# Patient Record
Sex: Female | Born: 1965 | Race: Black or African American | Hispanic: No | Smoking: Never smoker
Health system: Southern US, Community
[De-identification: ages and names within clinical notes are randomized; demographics above are authoritative.]

## PROBLEM LIST (undated history)

## (undated) DIAGNOSIS — M1990 Unspecified osteoarthritis, unspecified site: Secondary | ICD-10-CM

## (undated) DIAGNOSIS — I10 Essential (primary) hypertension: Secondary | ICD-10-CM

## (undated) HISTORY — PX: WISDOM TOOTH EXTRACTION: SHX21

## (undated) HISTORY — PX: ABDOMINAL HYSTERECTOMY: SHX81

---

## 2010-11-14 DIAGNOSIS — I10 Essential (primary) hypertension: Secondary | ICD-10-CM | POA: Insufficient documentation

## 2014-06-07 ENCOUNTER — Emergency Department (HOSPITAL_COMMUNITY): Payer: BLUE CROSS/BLUE SHIELD

## 2014-06-07 ENCOUNTER — Encounter (HOSPITAL_COMMUNITY): Payer: Self-pay | Admitting: Emergency Medicine

## 2014-06-07 ENCOUNTER — Emergency Department (HOSPITAL_COMMUNITY)
Admission: EM | Admit: 2014-06-07 | Discharge: 2014-06-07 | Disposition: A | Discharge status: Home / Self Care | Payer: BLUE CROSS/BLUE SHIELD | Attending: Emergency Medicine | Admitting: Emergency Medicine

## 2014-06-07 DIAGNOSIS — K5909 Other constipation: Secondary | ICD-10-CM | POA: Diagnosis not present

## 2014-06-07 DIAGNOSIS — R1031 Right lower quadrant pain: Secondary | ICD-10-CM | POA: Insufficient documentation

## 2014-06-07 DIAGNOSIS — K5900 Constipation, unspecified: Secondary | ICD-10-CM | POA: Diagnosis present

## 2014-06-07 HISTORY — DX: Essential (primary) hypertension: I10

## 2014-06-07 LAB — COMPREHENSIVE METABOLIC PANEL
ALT: 11 U/L (ref 0–35)
AST: 16 U/L (ref 0–37)
Albumin: 3.6 g/dL (ref 3.5–5.2)
Alkaline Phosphatase: 53 U/L (ref 39–117)
Anion gap: 4 — ABNORMAL LOW (ref 5–15)
BUN: 8 mg/dL (ref 6–23)
CO2: 30 mmol/L (ref 19–32)
CREATININE: 0.72 mg/dL (ref 0.50–1.10)
Calcium: 8.8 mg/dL (ref 8.4–10.5)
Chloride: 106 mmol/L (ref 96–112)
GFR calc Af Amer: >90 mL/min (ref >90)
Glucose, Bld: 100 mg/dL — ABNORMAL HIGH (ref 70–99)
Potassium: 3.6 mmol/L (ref 3.5–5.1)
Sodium: 140 mmol/L (ref 135–145)
Total Bilirubin: 0.2 mg/dL — ABNORMAL LOW (ref 0.3–1.2)
Total Protein: 6.7 g/dL (ref 6.0–8.3)

## 2014-06-07 LAB — URINALYSIS, ROUTINE W REFLEX MICROSCOPIC
BILIRUBIN URINE: NEGATIVE
Glucose, UA: NEGATIVE mg/dL
Hgb urine dipstick: NEGATIVE
KETONES UR: NEGATIVE mg/dL
Nitrite: NEGATIVE
Protein, ur: NEGATIVE mg/dL
Specific Gravity, Urine: 1.01 (ref 1.005–1.030)
Urobilinogen, UA: 0.2 mg/dL (ref 0.0–1.0)
pH: 7.5 (ref 5.0–8.0)

## 2014-06-07 LAB — CBC WITH DIFFERENTIAL/PLATELET
Basophils Absolute: 0 10*3/uL (ref 0.0–0.1)
Basophils Relative: 1 % (ref 0–1)
EOS ABS: 0.1 10*3/uL (ref 0.0–0.7)
EOS PCT: 2 % (ref 0–5)
HCT: 37.9 % (ref 36.0–46.0)
HEMOGLOBIN: 12.7 g/dL (ref 12.0–15.0)
Lymphocytes Relative: 49 % — ABNORMAL HIGH (ref 12–46)
Lymphs Abs: 2.8 10*3/uL (ref 0.7–4.0)
MCH: 32.1 pg (ref 26.0–34.0)
MCHC: 33.5 g/dL (ref 30.0–36.0)
MCV: 95.7 fL (ref 78.0–100.0)
Monocytes Absolute: 0.4 10*3/uL (ref 0.1–1.0)
Monocytes Relative: 7 % (ref 3–12)
NEUTROS ABS: 2.4 10*3/uL (ref 1.7–7.7)
Neutrophils Relative %: 41 % — ABNORMAL LOW (ref 43–77)
Platelets: 352 10*3/uL (ref 150–400)
RBC: 3.96 MIL/uL (ref 3.87–5.11)
RDW: 12.4 % (ref 11.5–15.5)
WBC: 5.7 10*3/uL (ref 4.0–10.5)

## 2014-06-07 LAB — URINE MICROSCOPIC-ADD ON

## 2014-06-07 LAB — LIPASE, BLOOD: Lipase: 23 U/L (ref 11–59)

## 2014-06-07 MED ORDER — HYDROMORPHONE HCL 1 MG/ML IJ SOLN
0.5000 mg | Freq: Once | INTRAMUSCULAR | Status: AC
  Administered 2014-06-07: 0.5 mg via INTRAVENOUS
  Filled 2014-06-07: qty 1

## 2014-06-07 MED ORDER — IOHEXOL 300 MG/ML  SOLN
100.0000 mL | Freq: Once | INTRAMUSCULAR | Status: AC | PRN
  Administered 2014-06-07: 100 mL via INTRAVENOUS

## 2014-06-07 MED ORDER — OXYCODONE-ACETAMINOPHEN 5-325 MG PO TABS: 1.0000 | ORAL_TABLET | Freq: Four times a day (QID) | ORAL | Status: DC | PRN

## 2014-06-07 MED ORDER — SODIUM CHLORIDE 0.9 % IV BOLUS (SEPSIS)
1000.0000 mL | INTRAVENOUS | Status: AC
  Administered 2014-06-07: 1000 mL via INTRAVENOUS

## 2014-06-07 MED ORDER — IOHEXOL 300 MG/ML  SOLN
25.0000 mL | Freq: Once | INTRAMUSCULAR | Status: AC | PRN
  Administered 2014-06-07: 25 mL via ORAL

## 2014-06-07 MED ORDER — HYDROMORPHONE HCL 1 MG/ML IJ SOLN
0.5000 mg | INTRAMUSCULAR | Status: AC
  Administered 2014-06-07: 0.5 mg via INTRAVENOUS
  Filled 2014-06-07: qty 1

## 2014-06-07 MED ORDER — PEG 3350-KCL-NABCB-NACL-NASULF 236 G PO SOLR: 4.0000 L | Freq: Once | ORAL | Status: DC

## 2014-06-07 NOTE — ED Notes (Signed)
Family at bedside. 

## 2014-06-07 NOTE — ED Notes (Signed)
CT informed patient done with contrast.  

## 2014-06-07 NOTE — ED Provider Notes (Signed)
CSN: [id number]     Arrival date & time 06/07/14  4098 History   First MD Initiated Contact with Patient 06/07/14 [id number]     Chief Complaint  Patient presents with  . Constipation     (Consider location/radiation/quality/duration/timing/severity/associated sxs/prior Treatment) Patient is a 49 y.o. female presenting with constipation. The history is provided by the patient.  Constipation Severity:  Moderate Time since last bowel movement:  4 days Timing:  Constant Progression:  Worsening Chronicity:  Recurrent Stool description:  Hard Unusual stool frequency:  Infrequent, every 1-2 weeks Relieved by:  Nothing Worsened by:  Nothing tried Ineffective treatments: dulcolax, otc laxatives. Associated symptoms: abdominal pain   Associated symptoms: no back pain, no diarrhea, no dysuria, no fever, no nausea and no vomiting     No past medical history on file. No past surgical history on file. No family history on file. History  Substance Use Topics  . Smoking status: Not on file  . Smokeless tobacco: Not on file  . Alcohol Use: Not on file   OB History    No data available     Review of Systems  Constitutional: Negative for fever and fatigue.  HENT: Negative for congestion and drooling.   Eyes: Negative for pain.  Respiratory: Negative for cough and shortness of breath.   Cardiovascular: Negative for chest pain.  Gastrointestinal: Positive for abdominal pain and constipation. Negative for nausea, vomiting and diarrhea.  Genitourinary: Negative for dysuria and hematuria.  Musculoskeletal: Negative for back pain, gait problem and neck pain.  Skin: Negative for color change.  Neurological: Negative for dizziness and headaches.  Hematological: Negative for adenopathy.  Psychiatric/Behavioral: Negative for behavioral problems.  All other systems reviewed and are negative.     Allergies  Review of patient's allergies indicates no known allergies.  Home Medications   Prior  to Admission medications   Not on File   BP 129/79 mmHg  Pulse 106  Temp(Src) 98.3 F (36.8 C) (Oral)  Resp 19  Ht  (1.626 m)  Wt 173 lb (78.472 kg)  BMI 29.68 kg/m2  SpO2 98% Physical Exam  Constitutional: She is oriented to person, place, and time. She appears well-developed and well-nourished.  HENT:  Head: Normocephalic.  Mouth/Throat: No oropharyngeal exudate.  Eyes: Conjunctivae and EOM are normal. Pupils are equal, round, and reactive to light.  Neck: Normal range of motion. Neck supple.  Cardiovascular: Normal rate, regular rhythm, normal heart sounds and intact distal pulses.  Exam reveals no gallop and no friction rub.   No murmur heard. Pulmonary/Chest: Effort normal and breath sounds normal. No respiratory distress. She has no wheezes.  Abdominal: Soft. Bowel sounds are normal. There is tenderness (mild ttp w/ deep palpation of rlq). There is no rebound and no guarding.  Genitourinary:  Normal-appearing external rectum. No stool noted in the rectal vault.  Musculoskeletal: Normal range of motion. She exhibits no edema or tenderness.  Neurological: She is alert and oriented to person, place, and time.  Skin: Skin is warm and dry.  Psychiatric: She has a normal mood and affect. Her behavior is normal.  Nursing note and vitals reviewed.   ED Course  Procedures (including critical care time) Labs Review Labs Reviewed  CBC WITH DIFFERENTIAL/PLATELET - Abnormal; Notable for the following:    Neutrophils Relative % 41 (*)    Lymphocytes Relative 49 (*)    All other components within normal limits  COMPREHENSIVE METABOLIC PANEL - Abnormal; Notable for the following:  Glucose, Bld 100 (*)    Total Bilirubin 0.2 (*)    Anion gap 4 (*)    All other components within normal limits  URINALYSIS, ROUTINE W REFLEX MICROSCOPIC - Abnormal; Notable for the following:    Leukocytes, UA SMALL (*)    All other components within normal limits  URINE MICROSCOPIC-ADD ON -  Abnormal; Notable for the following:    Squamous Epithelial / LPF FEW (*)    All other components within normal limits  LIPASE, BLOOD    Imaging Review Ct Abdomen Pelvis W Contrast  06/07/2014   CLINICAL DATA:  RLQ PAIN, NAUSEA, CONSTIPATION SINCE Wednesday, HYSTERECTOMY, HTN, PT. HAD INCOMPLETE COLONOSCOPY SEVERAL YEARS AGO IN [place] AND WAS SUPPOSED TO HAVE A FOLLOW-UP BUT WAS NON-COMPLIANT, MOTHER IN ROOM WITH PATIENT ASKED ME, "DID SHE TELL YOU ABOUT THE KNOT SHE FEELS ON THE RIGHT SIDE?"  EXAM: CT ABDOMEN AND PELVIS WITH CONTRAST  TECHNIQUE: Multidetector CT imaging of the abdomen and pelvis was performed using the standard protocol following bolus administration of intravenous contrast.  CONTRAST:  OMNIPAQUE IOHEXOL 300 MG/ML  SOLN  COMPARISON:  09/28/2010  FINDINGS: The lung bases demonstrate minimal linear atelectasis left base.  Abdominal images demonstrate a couple tiny subcentimeter liver hypodensities likely cysts. The spleen, pancreas, gallbladder and adrenal glands are within normal. Kidneys are normal in size without hydronephrosis or nephrolithiasis. Ureters are within normal. Appendix is normal. No free fluid or focal inflammatory change.  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 There is a 2.6 cm simple right ovarian cyst. Interval removal of previously noted left ovarian dermoid cyst. There is no free pelvic fluid. The bladder and rectosigmoid colon are unremarkable. Remainder of the exam is unchanged.  IMPRESSION: No acute findings in the abdomen/pelvis.  Couple tiny sub cm hypodensities within the liver likely cysts but too small to characterize.  Minimal left basilar linear atelectasis.   Electronically Signed   By: Elberta Fortis M.D.   On: 06/07/2014 10:31     EKG Interpretation None      MDM   Final diagnoses:  RLQ abdominal pain    8:20 AM 49 y.o. female who presents with right lower quadrant pain which began 3-4 days ago. She states that she  has a long history of constipation and her last bowel movement was approximately one week ago. She denies any associated symptoms including vomiting or fever. Vital signs are unremarkable here. Normal rectal exam without fecal impaction. Will get CT due to localized pain.    I interpreted/reviewed the labs and/or imaging which were non-contributory. I offered golytely and the pt would like this. Given her failure to have a bowel movement with most over-the-counter agents to think this is reasonable. She requested information on following up with a PCP. I added this to her discharge paperwork. I have discussed the diagnosis/risks/treatment options with the patient and believe the pt to be eligible for discharge home to follow-up with and establish w/ a pcp. We also discussed returning to the ED immediately if new or worsening sx occur. We discussed the sx which are most concerning (e.g., worsening pain, fever, vomiting) that necessitate immediate return. Medications administered to the patient during their visit and any new prescriptions provided to the patient are listed below.  Medications given during this visit Medications  HYDROmorphone (DILAUDID) injection 0.5 mg (0.5 mg Intravenous Given 06/07/14 0831)  sodium chloride 0.9 % bolus 1,000 mL (0 mLs Intravenous Stopped 06/07/14 1156)  iohexol (OMNIPAQUE)  300 MG/ML solution 25 mL (25 mLs Oral Contrast Given 06/07/14 0832)  iohexol (OMNIPAQUE) 300 MG/ML solution 100 mL (100 mLs Intravenous Contrast Given 06/07/14 0939)  HYDROmorphone (DILAUDID) injection 0.5 mg (0.5 mg Intravenous Given 06/07/14 1035)    Discharge Medication List as of 06/07/2014 11:59 AM    START taking these medications   Details  oxyCODONE-acetaminophen (PERCOCET) 5-325 MG per tablet Take 1 tablet by mouth every 6 (six) hours as needed for moderate pain., Starting 06/07/2014, Until Discontinued, Print    polyethylene glycol (GOLYTELY) 236 G solution Take 4,000 mLs by mouth once., Starting  06/07/2014, Print           Purvis SheffieldForrest Dimitriy Carreras, MD 06/07/14 [phone]

## 2014-06-07 NOTE — Discharge Instructions (Signed)
Abdominal Pain, Women °Abdominal (stomach, pelvic, or belly) pain can be caused by many things. It is important to tell your doctor: °· The location of the pain. °· Does it come and go or is it present all the time? °· Are there things that start the pain (eating certain foods, exercise)? °· Are there other symptoms associated with the pain (fever, nausea, vomiting, diarrhea)? °All of this is helpful to know when trying to find the cause of the pain. °CAUSES  °· Stomach: virus or bacteria infection, or ulcer. °· Intestine: appendicitis (inflamed appendix), regional ileitis (Crohn's disease), ulcerative colitis (inflamed colon), irritable bowel syndrome, diverticulitis (inflamed diverticulum of the colon), or cancer of the stomach or intestine. °· Gallbladder disease or stones in the gallbladder. °· Kidney disease, kidney stones, or infection. °· Pancreas infection or cancer. °· Fibromyalgia (pain disorder). °· Diseases of the female organs: °· Uterus: fibroid (non-cancerous) tumors or infection. °· Fallopian tubes: infection or tubal pregnancy. °· Ovary: cysts or tumors. °· Pelvic adhesions (scar tissue). °· Endometriosis (uterus lining tissue growing in the pelvis and on the pelvic organs). °· Pelvic congestion syndrome (female organs filling up with blood just before the menstrual period). °· Pain with the menstrual period. °· Pain with ovulation (producing an egg). °· Pain with an IUD (intrauterine device, birth control) in the uterus. °· Cancer of the female organs. °· Functional pain (pain not caused by a disease, may improve without treatment). °· Psychological pain. °· Depression. °DIAGNOSIS  °Your doctor will decide the seriousness of your pain by doing an examination. °· Blood tests. °· X-rays. °· Ultrasound. °· CT scan (computed tomography, special type of X-ray). °· MRI (magnetic resonance imaging). °· Cultures, for infection. °· Barium enema (dye inserted in the large intestine, to better view it with  X-rays). °· Colonoscopy (looking in intestine with a lighted tube). °· Laparoscopy (minor surgery, looking in abdomen with a lighted tube). °· Major abdominal exploratory surgery (looking in abdomen with a large incision). °TREATMENT  °The treatment will depend on the cause of the pain.  °· Many cases can be observed and treated at home. °· Over-the-counter medicines recommended by your caregiver. °· Prescription medicine. °· Antibiotics, for infection. °· Birth control pills, for painful periods or for ovulation pain. °· Hormone treatment, for endometriosis. °· Nerve blocking injections. °· Physical therapy. °· Antidepressants. °· Counseling with a psychologist or psychiatrist. °· Minor or major surgery. °HOME CARE INSTRUCTIONS  °· Do not take laxatives, unless directed by your caregiver. °· Take over-the-counter pain medicine only if ordered by your caregiver. Do not take aspirin because it can cause an upset stomach or bleeding. °· Try a clear liquid diet (broth or water) as ordered by your caregiver. Slowly move to a bland diet, as tolerated, if the pain is related to the stomach or intestine. °· Have a thermometer and take your temperature several times a day, and record it. °· Bed rest and sleep, if it helps the pain. °· Avoid sexual intercourse, if it causes pain. °· Avoid stressful situations. °· Keep your follow-up appointments and tests, as your caregiver orders. °· If the pain does not go away with medicine or surgery, you may try: °· Acupuncture. °· Relaxation exercises (yoga, meditation). °· Group therapy. °· Counseling. °SEEK MEDICAL CARE IF:  °· You notice certain foods cause stomach pain. °· Your home care treatment is not helping your pain. °· You need stronger pain medicine. °· You want your IUD removed. °· You feel faint or   lightheaded. °· You develop nausea and vomiting. °· You develop a rash. °· You are having side effects or an allergy to your medicine. °SEEK IMMEDIATE MEDICAL CARE IF:  °· Your  pain does not go away or gets worse. °· You have a fever. °· Your pain is felt only in portions of the abdomen. The right side could possibly be appendicitis. The left lower portion of the abdomen could be colitis or diverticulitis. °· You are passing blood in your stools (bright red or black tarry stools, with or without vomiting). °· You have blood in your urine. °· You develop chills, with or without a fever. °· You pass out. °MAKE SURE YOU:  °· Understand these instructions. °· Will watch your condition. °· Will get help right away if you are not doing well or get worse. °Document Released: 02/12/2007 Document Revised: 09/01/2013 Document Reviewed: 03/04/2009 °ExitCare® Patient Information ©2015 ExitCare, LLC. This information is not intended to replace advice given to you by your health care provider. Make sure you discuss any questions you have with your health care provider. ° °Constipation °Constipation is when a person has fewer than three bowel movements a week, has difficulty having a bowel movement, or has stools that are dry, hard, or larger than normal. As people grow older, constipation is more common. If you try to fix constipation with medicines that make you have a bowel movement (laxatives), the problem may get worse. Long-term laxative use may cause the muscles of the colon to become weak. A low-fiber diet, not taking in enough fluids, and taking certain medicines may make constipation worse.  °CAUSES  °· Certain medicines, such as antidepressants, pain medicine, iron supplements, antacids, and water pills.   °· Certain diseases, such as diabetes, irritable bowel syndrome (IBS), thyroid disease, or depression.   °· Not drinking enough water.   °· Not eating enough fiber-rich foods.   °· Stress or travel.   °· Lack of physical activity or exercise.   °· Ignoring the urge to have a bowel movement.   °· Using laxatives too much.   °SIGNS AND SYMPTOMS  °· Having fewer than three bowel movements a  week.   °· Straining to have a bowel movement.   °· Having stools that are hard, dry, or larger than normal.   °· Feeling full or bloated.   °· Pain in the lower abdomen.   °· Not feeling relief after having a bowel movement.   °DIAGNOSIS  °Your health care provider will take a medical history and perform a physical exam. Further testing may be done for severe constipation. Some tests may include: °· A barium enema X-ray to examine your rectum, colon, and, sometimes, your small intestine.   °· A sigmoidoscopy to examine your lower colon.   °· A colonoscopy to examine your entire colon. °TREATMENT  °Treatment will depend on the severity of your constipation and what is causing it. Some dietary treatments include drinking more fluids and eating more fiber-rich foods. Lifestyle treatments may include regular exercise. If these diet and lifestyle recommendations do not help, your health care provider may recommend taking over-the-counter laxative medicines to help you have bowel movements. Prescription medicines may be prescribed if over-the-counter medicines do not work.  °HOME CARE INSTRUCTIONS  °· Eat foods that have a lot of fiber, such as fruits, vegetables, whole grains, and beans. °· Limit foods high in fat and processed sugars, such as french fries, hamburgers, cookies, candies, and soda.   °· A fiber supplement may be added to your diet if you cannot get enough fiber from foods.   °·   Drink enough fluids to keep your urine clear or pale yellow.   Exercise regularly or as directed by your health care provider.   Go to the restroom when you have the urge to go. Do not hold it.   Only take over-the-counter or prescription medicines as directed by your health care provider. Do not take other medicines for constipation without talking to your health care provider first.  SEEK IMMEDIATE MEDICAL CARE IF:   You have bright red blood in your stool.   Your constipation lasts for more than 4 days or gets  worse.   You have abdominal or rectal pain.   You have thin, pencil-like stools.   You have unexplained weight loss. MAKE SURE YOU:   Understand these instructions.  Will watch your condition.  Will get help right away if you are not doing well or get worse. Document Released: 01/14/2004 Document Revised: 04/22/2013 Document Reviewed: 01/27/2013 [facility] Patient Information 2015 [place], [place]. This information is not intended to replace advice given to you by your health care provider. Make sure you discuss any questions you have with your health care provider.   Emergency Department Resource Guide 1) Find a Doctor and Pay Out of Pocket Although you won't have to find out who is covered by your insurance plan, it is a good idea to ask around and get recommendations. You will then need to call the office and see if the doctor you have chosen will accept you as a new patient and what types of options they offer for patients who are self-pay. Some doctors offer discounts or will set up payment plans for their patients who do not have insurance, but you will need to ask so you aren't surprised when you get to your appointment.  2) Contact Your Local Health Department Not all health departments have doctors that can see patients for sick visits, but many do, so it is worth a call to see if yours does. If you don't know where your local health department is, you can check in your phone book. The CDC also has a tool to help you locate your state's health department, and many state websites also have listings of all of their local health departments.  3) Find a Walk-in Clinic If your illness is not likely to be very severe or complicated, you may want to try a walk in clinic. These are popping up all over the country in pharmacies, drugstores, and shopping centers. They're usually staffed by nurse practitioners or physician assistants that have been trained to treat common illnesses and  complaints. They're usually fairly quick and inexpensive. However, if you have serious medical issues or chronic medical problems, these are probably not your best option.  No Primary Care Doctor: - Call Health Connect at  [phone] - they can help you locate a primary care doctor that  accepts your insurance, provides certain services, etc. - Physician Referral Service- [phone]  Chronic Pain Problems: Organization         Address  Phone   Notes  [facility]  [phone] Patients need to be referred by their primary care doctor.   Medication Assistance: Organization         Address  Phone   Notes  [facility] [address] [place], [place] [phone] --Must be a resident of [facility] [place] -- Must have NO insurance coverage whatsoever (no Medicaid/ Medicare, etc.) -- The pt. MUST have  a primary care doctor that directs their care regularly and follows them in the community   [facility]ist  [phone]   [employer]  [phone]    Agencies that provide inexpensive medical care: Organization         Address  Phone   Notes  [facility]  [phone]   [facility]    [phone]   [facility] [address] [place], [place] [address] [phone]   [facility] [address] [place], [place] [phone]   Planned Parenthood    [phone]   [facility]    [phone]   [facility]  [address], [place] Phone:  [phone], Fax:  [phone] Hours of Operation:  9 am - 6 pm, M-F.  Also accepts Medicaid/Medicare and self-pay.  [facility]  [address], [place] Phone: [phone], Fax: [phone]. Hours of Operation:  8:30 am - 5:30 pm, M-F.  Also accepts Medicaid and self-pay.  [facility] [place] [address] [place] Phone: [phone]   [facility] [address] Natasha Bence [place], [place] [phone], Ext. [phone] Mondays & Thursdays: 7-9 AM.  First 15 patients are seen on a first come, first serve basis.    Medicaid-accepting [facility] Providers:  Organization         Address  Phone   Notes  [facility] [address], [place] [phone] Also accepts self-pay patients.  [facility] [address] Laurell Josephs [place], [place]  [phone]   [facility] [address], [place] [phone]   [facility] [address] [place] [phone]   Renaye Rakers [address], [place]   [phone] Only accepts [place] Access [place] patients after they have their name applied to their card.   Self-Pay (no insurance) in [facility]:  Organization         Address  Phone   Notes  Sickle Cell Patients, [facility] [address] [place], [place] [phone]   [facility] [address] [place], [place] [phone]   [facility]  [address], [place] [phone]   [facility]/Dr. Osei-Bonsu  [address], [place] or [address], [place] [phone] Phone number for both [place] and [place] locations is the same.  [facility] [address], [place] [phone]   [facility] [address], [place] or [address] [phone]   [facility] [address] [place] [phone], phone; [phone], fax Sees patients 1st and 3rd Saturday of every month.  Must not qualify for public or private insurance (i.e. Medicaid, Medicare, [place] Health Choice, Veterans' Benefits)  Household income should be no more than 200% of the poverty level  The clinic cannot treat you if you are pregnant or think you are pregnant  Sexually transmitted diseases are not treated at the clinic.    Dental Care: Organization         Address  Phone  Notes  [facility] Department of Public Health [facility] [address] [place], [place] (  [phone] Accepts children up to age 37 who are enrolled in Medicaid or [place] Health Choice; pregnant women with a Medicaid card; and children who have applied for Medicaid or [place] Health Choice, but were declined, whose parents can pay a reduced fee at time of service.  [facility] Department of [facility]  [address], [place] [phone] Accepts children up to age 40 who are enrolled in [place] or [place] Health Choice; pregnant women with a Medicaid card; and children who have applied for Medicaid or [place] Health Choice, but were declined, whose parents can pay a reduced fee at time of service.  [facility]  [address] [place], [place] [phone] Patients are seen by appointment only. Walk-ins are not accepted. [facility] will see patients 24 years of age and older. Monday - Tuesday (8am-5pm) Most Wednesdays (8:30-5pm) $30 per visit, cash only  [facility] Access PRO[facility]  [address], [facility] [phone] Patients are seen by appointment only. Walk-ins are not accepted. [facility] will see patients 8 years of age and older. One Wednesday Evening (Monthly: Volunteer Based).  $30 per visit, cash only  [employer]  [phone] for adults; Children under age 64, call Graduate Pediatric Dentistry at [phone]. Children aged 67-14, please call [phone] to request a pediatric application.  Dental services are provided in all areas of dental care including fillings, crowns and bridges, complete and partial dentures, implants, gum treatment, root canals, and extractions. Preventive care is  also provided. Treatment is provided to both adults and children. Patients are selected via a lottery and there is often a waiting list.   [facility] [address] [employer] [place]  [phone] [email or web address]   [facility] [address] [place], [place] [phone], Ext. [phone] Second and Fourth Thursday of each month, opens at 6:30 AM; Clinic ends at 9 AM.  Patients are seen on a first-come first-served basis, and a limited number are seen during each clinic.   [facility]  [address] Ether Griffins [place], [place] [phone]   Eligibility Requirements You must have lived in [place], [place], or [place] counties for at least the last three months.   You cannot be eligible for state or federal sponsored [place], including [employer], [place], or [employer].   You generally cannot be eligible for healthcare insurance through your employer.    How to apply: Eligibility screenings are held every Tuesday and Wednesday afternoon from 1:00 pm until 4:00 pm. You do not need an appointment for the interview!  [facility] [address] [place], [place] [phone]   [facility] Health Department  [phone]   [facility] Health Department  [phone]   [facility] Health Department  [phone]    Behavioral Health Resources in the Community: Intensive Outpatient Programs Organization         Address  Phone  Notes  [facility] Services [address], [place], [place] [phone]   [facility] Outpatient [address], [place], [place] [phone]   [facility] [address] [place], [place]  [phone]   [facility] Mental Health [address]  [place], [place] [phone] or [phone]   Substance Abuse Resources Organization         Address  Phone  Notes  [facility] Drug Services  [phone]  [facility]  [phone]   [facility] [place]  [phone]   Floydene Flock  [phone]   Residential & Outpatient Substance Abuse Program  [phone]   Psychological Services Organization         Address  Phone  Notes  [facility]  [phone]   [facility]  [phone]   [facility] Mental Health [address] [place] [phone] or [phone]    Mobile Crisis Teams Organization         Address  Phone  Notes  [facility], Mobile Crisis Care Unit  [phone]   [facility]  [address] [place], [place] [phone]   Doristine Locks [address] [place] [phone]    Self-Help/Support Groups Organization         Address  Phone             Notes  [facility] [place] - variety of support groups  336- I7437963 Call for more information  Narcotics Anonymous (NA), [facility] [address], [employer] [place]  2 meetings at this location   Statistician         Address  Phone  Notes  [facility] [address],    [place]  [phone]   [facility]  [address], [place] [address], [place], [place] [phone]   [facility] [address] [place], [place] [phone] Admissions: 8am-3pm M-F  [facility] [address],    [place], [place] [phone]   [facility] [address] [place], [place], [place] [phone]   [facility] [address]  [place], [place] [phone]   [facility] - Intensive Outpatient [address] Laurell Josephs [address], [place], [place] [phone]   [facility] ([facility] [address] [place]  [place], [place] [phone] or [phone]   [facility] ([facility]) [address], [place], [place] [phone] Accepts Medicaid  [facility] [employer] [address]  [place] [phone]  Substance Abuse/Addiction Treatment   [facility] Organization         Address  Phone  Notes  [facility]  [phone]   Angie Fava, PhD [address] Ervin Knack [place], [place]   [phone] or [phone]   [facility] Behavioral   [address] [place], [place] [phone]   [facility] [address] [place], [place] [phone] Insurance/Medicaid/sponsorship through [facility] and Families [address]                                    [place], [place] [phone] Therapy/tele-psych/case  [facility] [address], [place] [phone]    Dr. Lolly Mustache  [phone]   [facility]  [employer] [facility] Dept. 1) [address], [place] 2) [address], [place] 3)  [address], [place] [phone]  [phone]   [facility] Child Abuse Hotline (650)296-1942 or [phone] (After Hours)

## 2014-06-07 NOTE — ED Notes (Addendum)
Pt states: I had a colonoscopy one time and they couldn't finish it because I had a blockage and they wanted me to go to the hospital and this was two years ago. This time, I have not been able to go since Wednesday and I have taken four pink laxatives and a few of those orange ones too. I have a knot in the right side of my belly and it hurts and makes me nauseous. Pt reports she really doesn't ever have BM's and can go two weeks without having one and when she does it is small pebbles.  Pt reports not drinking water with her laxatives, pain 10/10.

## 2015-04-08 ENCOUNTER — Encounter (HOSPITAL_COMMUNITY): Payer: Self-pay | Admitting: Emergency Medicine

## 2015-04-08 ENCOUNTER — Emergency Department (HOSPITAL_COMMUNITY)
Admission: EM | Admit: 2015-04-08 | Discharge: 2015-04-08 | Disposition: A | Discharge status: Home / Self Care | Payer: BLUE CROSS/BLUE SHIELD | Attending: Emergency Medicine | Admitting: Emergency Medicine

## 2015-04-08 DIAGNOSIS — I10 Essential (primary) hypertension: Secondary | ICD-10-CM | POA: Diagnosis not present

## 2015-04-08 DIAGNOSIS — Z90710 Acquired absence of both cervix and uterus: Secondary | ICD-10-CM | POA: Diagnosis not present

## 2015-04-08 DIAGNOSIS — K5900 Constipation, unspecified: Secondary | ICD-10-CM | POA: Diagnosis not present

## 2015-04-08 DIAGNOSIS — R110 Nausea: Secondary | ICD-10-CM | POA: Diagnosis not present

## 2015-04-08 DIAGNOSIS — Z79899 Other long term (current) drug therapy: Secondary | ICD-10-CM | POA: Insufficient documentation

## 2015-04-08 DIAGNOSIS — R1031 Right lower quadrant pain: Secondary | ICD-10-CM | POA: Insufficient documentation

## 2015-04-08 LAB — COMPREHENSIVE METABOLIC PANEL
ALBUMIN: 3.6 g/dL (ref 3.5–5.0)
ALT: 13 U/L — ABNORMAL LOW (ref 14–54)
AST: 15 U/L (ref 15–41)
Alkaline Phosphatase: 59 U/L (ref 38–126)
Anion gap: 6 (ref 5–15)
BUN: 8 mg/dL (ref 6–20)
CHLORIDE: 108 mmol/L (ref 101–111)
CO2: 26 mmol/L (ref 22–32)
Calcium: 9.4 mg/dL (ref 8.9–10.3)
Creatinine, Ser: 0.66 mg/dL (ref 0.44–1.00)
GFR calc Af Amer: >60 mL/min (ref >60)
GFR calc non Af Amer: >60 mL/min (ref >60)
GLUCOSE: 108 mg/dL — AB (ref 65–99)
POTASSIUM: 3.7 mmol/L (ref 3.5–5.1)
Sodium: 140 mmol/L (ref 135–145)
Total Bilirubin: 0.3 mg/dL (ref 0.3–1.2)
Total Protein: 7 g/dL (ref 6.5–8.1)

## 2015-04-08 LAB — URINALYSIS, ROUTINE W REFLEX MICROSCOPIC
Bilirubin Urine: NEGATIVE
GLUCOSE, UA: NEGATIVE mg/dL
Hgb urine dipstick: NEGATIVE
KETONES UR: NEGATIVE mg/dL
LEUKOCYTES UA: NEGATIVE
Nitrite: NEGATIVE
PH: 6 (ref 5.0–8.0)
Protein, ur: NEGATIVE mg/dL
SPECIFIC GRAVITY, URINE: 1.019 (ref 1.005–1.030)

## 2015-04-08 LAB — CBC
HEMATOCRIT: 41.1 % (ref 36.0–46.0)
HEMOGLOBIN: 13.8 g/dL (ref 12.0–15.0)
MCH: 32 pg (ref 26.0–34.0)
MCHC: 33.6 g/dL (ref 30.0–36.0)
MCV: 95.4 fL (ref 78.0–100.0)
Platelets: 383 10*3/uL (ref 150–400)
RBC: 4.31 MIL/uL (ref 3.87–5.11)
RDW: 12.4 % (ref 11.5–15.5)
WBC: 5.4 10*3/uL (ref 4.0–10.5)

## 2015-04-08 LAB — LIPASE, BLOOD: LIPASE: 23 U/L (ref 11–51)

## 2015-04-08 MED ORDER — SODIUM CHLORIDE 0.9 % IV BOLUS (SEPSIS)
1000.0000 mL | Freq: Once | INTRAVENOUS | Status: AC
  Administered 2015-04-08: 1000 mL via INTRAVENOUS

## 2015-04-08 MED ORDER — NAPROXEN 500 MG PO TABS: 500.0000 mg | ORAL_TABLET | Freq: Two times a day (BID) | ORAL | Status: DC

## 2015-04-08 MED ORDER — DOCUSATE SODIUM 100 MG PO CAPS: 100.0000 mg | ORAL_CAPSULE | Freq: Two times a day (BID) | ORAL | Status: AC

## 2015-04-08 MED ORDER — PEG 3350-KCL-NABCB-NACL-NASULF 236 G PO SOLR: 4.0000 L | Freq: Once | ORAL | Status: DC

## 2015-04-08 MED ORDER — ONDANSETRON HCL 4 MG/2ML IJ SOLN
4.0000 mg | Freq: Once | INTRAMUSCULAR | Status: AC
  Administered 2015-04-08: 4 mg via INTRAVENOUS
  Filled 2015-04-08: qty 2

## 2015-04-08 MED ORDER — POLYETHYLENE GLYCOL 3350 17 GM/SCOOP PO POWD: ORAL | Status: AC

## 2015-04-08 MED ORDER — MORPHINE SULFATE (PF) 4 MG/ML IV SOLN
4.0000 mg | Freq: Once | INTRAVENOUS | Status: AC
  Administered 2015-04-08: 4 mg via INTRAVENOUS
  Filled 2015-04-08: qty 1

## 2015-04-08 MED ORDER — KETOROLAC TROMETHAMINE 30 MG/ML IJ SOLN
30.0000 mg | Freq: Once | INTRAMUSCULAR | Status: AC
  Administered 2015-04-08: 30 mg via INTRAVENOUS
  Filled 2015-04-08: qty 1

## 2015-04-08 NOTE — ED Provider Notes (Signed)
CSN: [id number]     Arrival date & time 04/08/15  [id number] History   First MD Initiated Contact with Patient 04/08/15 1056     Chief Complaint  Patient presents with  . Abdominal Pain     (Consider location/radiation/quality/duration/timing/severity/associated sxs/prior Treatment) Patient is a 49 y.o. female presenting with abdominal pain.  Abdominal Pain Pain location:  RLQ Pain quality: aching   Pain quality comment:  Like a knot Pain radiates to:  Does not radiate Pain severity:  Moderate Onset quality:  Gradual Duration:  4 days Timing:  Intermittent (waxing and waning) Progression:  Waxing and waning Chronicity:  New Relieved by:  Nothing Worsened by:  Nothing tried Ineffective treatments: laxatives. Associated symptoms: constipation and nausea   Associated symptoms: no chest pain, no diarrhea, no dysuria, no fever, no hematemesis, no hematochezia, no hematuria, no melena, no shortness of breath, no vaginal bleeding, no vaginal discharge and no vomiting     Patient with hx of constipation presents with RLQ pain x 4 days she describes as feeling like a knot and similar to the past when she was found to have constipation.  She has tried x-lax and ducolax and had a BM yesterday.  She reports she typically has a BM every 2 weeks or so.  She does not take anything QD for constipation.  Denies changes in BM or stool caliber.  No bloody stools.    Past Medical History  Diagnosis Date  . Hypertension    Past Surgical History  Procedure Laterality Date  . Abdominal hysterectomy     History reviewed. No pertinent family history. Social History  Substance Use Topics  . Smoking status: Never Smoker   . Smokeless tobacco: Never Used  . Alcohol Use: No   OB History    No data available     Review of Systems  Constitutional: Negative for fever.  Respiratory: Negative for shortness of breath.   Cardiovascular: Negative for chest pain.  Gastrointestinal: Positive for nausea,  abdominal pain, constipation, abdominal distention (bloating) and rectal pain. Negative for vomiting, diarrhea, blood in stool, melena, hematochezia, anal bleeding and hematemesis.  Genitourinary: Negative for dysuria, urgency, frequency, hematuria, flank pain, vaginal bleeding and vaginal discharge.  All other systems reviewed and are negative.     Allergies  Review of patient's allergies indicates no known allergies.  Home Medications   Prior to Admission medications   Medication Sig Start Date End Date Taking? Authorizing Provider  amLODipine (NORVASC) 5 MG tablet Take 5 mg by mouth daily. 03/19/15  Yes Historical Provider, MD  losartan (COZAAR) 100 MG tablet Take 100 mg by mouth daily. 03/19/15  Yes Historical Provider, MD  docusate sodium (COLACE) 100 MG capsule Take 1 capsule (100 mg total) by mouth every 12 (twelve) hours. 04/08/15   Cheri FowlerKayla Kizzy Olafson, PA-C  naproxen (NAPROSYN) 500 MG tablet Take 1 tablet (500 mg total) by mouth 2 (two) times daily. 04/08/15   Cheri FowlerKayla Mylani Gentry, PA-C  polyethylene glycol (GOLYTELY) 236 G solution Take 4,000 mLs by mouth once. 04/08/15   Pricilla LovelessScott Goldston, MD  polyethylene glycol powder (GLYCOLAX/MIRALAX) powder Take 1 capful dissolved in 4-8 ounces of water/juice daily 04/08/15   Romaine Maciolek, PA-C   BP 116/69 mmHg  Pulse 73  Temp(Src) 98.2 F (36.8 C) (Oral)  Resp 20  SpO2 98% Physical Exam  Constitutional: She is oriented to person, place, and time. She appears well-developed and well-nourished.  HENT:  Head: Normocephalic and atraumatic.  Mouth/Throat: Oropharynx is clear and moist.  Eyes: Conjunctivae are normal. Pupils are equal, round, and reactive to light.  Neck: Normal range of motion. Neck supple.  Cardiovascular: Normal rate, regular rhythm and normal heart sounds.   No murmur heard. Pulmonary/Chest: Effort normal and breath sounds normal. No accessory muscle usage or stridor. No respiratory distress. She has no wheezes. She has no rhonchi. She has no  rales.  Abdominal: Soft. Bowel sounds are normal. She exhibits no distension. There is tenderness in the right upper quadrant and right lower quadrant. There is no rebound and no guarding.  Genitourinary: Rectum normal. Rectal exam shows no external hemorrhoid, no internal hemorrhoid and no tenderness.  Musculoskeletal: Normal range of motion.  Lymphadenopathy:    She has no cervical adenopathy.  Neurological: She is alert and oriented to person, place, and time.  Speech clear without dysarthria.  Skin: Skin is warm and dry.  Psychiatric: She has a normal mood and affect. Her behavior is normal.    ED Course  Procedures (including critical care time) Labs Review Labs Reviewed  COMPREHENSIVE METABOLIC PANEL - Abnormal; Notable for the following:    Glucose, Bld 108 (*)    ALT 13 (*)    All other components within normal limits  LIPASE, BLOOD  CBC  URINALYSIS, ROUTINE W REFLEX MICROSCOPIC (NOT AT [facility])    Imaging Review No results found. I have personally reviewed and evaluated these images and lab results as part of my medical decision-making.   EKG Interpretation None      MDM   Final diagnoses:  Right lower quadrant abdominal pain  Constipation, unspecified constipation type    Patient presents with RLQ pain x 4 days with hx of constipation.  No fevers, change in BM or change in stool caliber.  No bloody stools.  VSS, NAD.  On exam, heart RRR, lungs CTAB, abdomen soft with tenderness in RLQ and RUQ.  No rebound or guarding.  Concern for constipation, volvulus, appendicitis, SBO, fecal impaction.  Will obtain labs.  Will give fluids, zofran, and morphine.  Labs unremarkable.  No indication for imaging at this time.  Low suspicion for volvulus, appendicitis, obstruction.  High suspicion for constipation.  Patient's symptoms have improved.  Will d/c home with naproxen, golytely, miralax, and colace.  Will also provide resource list for establishing PCP.  Discussed strict return  precautions including worsening pain or no pain relief with BM and new bowel regimen.  Patient agrees and acknowledges the above plan for discharge. Case has been discussed with and seen by Dr. Criss Alvine who agrees with the above plan for discharge.     Cheri Fowler, PA-C 04/08/15 1313  Pricilla Loveless, MD 04/11/15 [phone]

## 2015-04-08 NOTE — Discharge Instructions (Signed)
Abdominal Pain, Adult °Many things can cause abdominal pain. Usually, abdominal pain is not caused by a disease and will improve without treatment. It can often be observed and treated at home. Your health care provider will do a physical exam and possibly order blood tests and X-rays to help determine the seriousness of your pain. However, in many cases, more time must pass before a clear cause of the pain can be found. Before that point, your health care provider may not know if you need more testing or further treatment. °HOME CARE INSTRUCTIONS °Monitor your abdominal pain for any changes. The following actions may help to alleviate any discomfort you are experiencing: °· Only take over-the-counter or prescription medicines as directed by your health care provider. °· Do not take laxatives unless directed to do so by your health care provider. °· Try a clear liquid diet (broth, tea, or water) as directed by your health care provider. Slowly move to a bland diet as tolerated. °SEEK MEDICAL CARE IF: °· You have unexplained abdominal pain. °· You have abdominal pain associated with nausea or diarrhea. °· You have pain when you urinate or have a bowel movement. °· You experience abdominal pain that wakes you in the night. °· You have abdominal pain that is worsened or improved by eating food. °· You have abdominal pain that is worsened with eating fatty foods. °· You have a fever. °SEEK IMMEDIATE MEDICAL CARE IF: °· Your pain does not go away within 2 hours. °· You keep throwing up (vomiting). °· Your pain is felt only in portions of the abdomen, such as the right side or the left lower portion of the abdomen. °· You pass bloody or black tarry stools. °MAKE SURE YOU: °· Understand these instructions. °· Will watch your condition. °· Will get help right away if you are not doing well or get worse. °  °This information is not intended to replace advice given to you by your health care provider. Make sure you discuss  any questions you have with your health care provider. °  °Document Released: 01/25/2005 Document Revised: 01/06/2015 Document Reviewed: 12/25/2012 °Elsevier Interactive Patient Education ©2016 Elsevier Inc. ° °Constipation, Adult °Constipation is when a person has fewer than three bowel movements a week, has difficulty having a bowel movement, or has stools that are dry, hard, or larger than normal. As people grow older, constipation is more common. A low-fiber diet, not taking in enough fluids, and taking certain medicines may make constipation worse.  °CAUSES  °· Certain medicines, such as antidepressants, pain medicine, iron supplements, antacids, and water pills.   °· Certain diseases, such as diabetes, irritable bowel syndrome (IBS), thyroid disease, or depression.   °· Not drinking enough water.   °· Not eating enough fiber-rich foods.   °· Stress or travel.   °· Lack of physical activity or exercise.   °· Ignoring the urge to have a bowel movement.   °· Using laxatives too much.   °SIGNS AND SYMPTOMS  °· Having fewer than three bowel movements a week.   °· Straining to have a bowel movement.   °· Having stools that are hard, dry, or larger than normal.   °· Feeling full or bloated.   °· Pain in the lower abdomen.   °· Not feeling relief after having a bowel movement.   °DIAGNOSIS  °Your health care provider will take a medical history and perform a physical exam. Further testing may be done for severe constipation. Some tests may include: °· A barium enema X-ray to examine your rectum, colon, and, sometimes,   your small intestine.   A sigmoidoscopy to examine your lower colon.   A colonoscopy to examine your entire colon. TREATMENT  Treatment will depend on the severity of your constipation and what is causing it. Some dietary treatments include drinking more fluids and eating more fiber-rich foods. Lifestyle treatments may include regular exercise. If these diet and lifestyle recommendations do not  help, your health care provider may recommend taking over-the-counter laxative medicines to help you have bowel movements. Prescription medicines may be prescribed if over-the-counter medicines do not work.  HOME CARE INSTRUCTIONS   Eat foods that have a lot of fiber, such as fruits, vegetables, whole grains, and beans.  Limit foods high in fat and processed sugars, such as french fries, hamburgers, cookies, candies, and soda.   A fiber supplement may be added to your diet if you cannot get enough fiber from foods.   Drink enough fluids to keep your urine clear or pale yellow.   Exercise regularly or as directed by your health care provider.   Go to the restroom when you have the urge to go. Do not hold it.   Only take over-the-counter or prescription medicines as directed by your health care provider. Do not take other medicines for constipation without talking to your health care provider first.  SEEK IMMEDIATE MEDICAL CARE IF:   You have bright red blood in your stool.   Your constipation lasts for more than 4 days or gets worse.   You have abdominal or rectal pain.   You have thin, pencil-like stools.   You have unexplained weight loss. MAKE SURE YOU:   Understand these instructions.  Will watch your condition.  Will get help right away if you are not doing well or get worse.   This information is not intended to replace advice given to you by your health care provider. Make sure you discuss any questions you have with your health care provider.   Document Released: 01/14/2004 Document Revised: 05/08/2014 Document Reviewed: 01/27/2013 Elsevier Interactive Patient Education 2016 [employer].   Emergency Department Resource Guide 1) Find a Doctor and Pay Out of Pocket Although you won't have to find out who is covered by your insurance plan, it is a good idea to ask around and get recommendations. You will then need to call the office and see if the doctor you  have chosen will accept you as a new patient and what types of options they offer for patients who are self-pay. Some doctors offer discounts or will set up payment plans for their patients who do not have insurance, but you will need to ask so you aren't surprised when you get to your appointment.  2) Contact Your Local Health Department Not all health departments have doctors that can see patients for sick visits, but many do, so it is worth a call to see if yours does. If you don't know where your local health department is, you can check in your phone book. The CDC also has a tool to help you locate your state's health department, and many state websites also have listings of all of their local health departments.  3) Find a Walk-in Clinic If your illness is not likely to be very severe or complicated, you may want to try a walk in clinic. These are popping up all over the country in pharmacies, drugstores, and shopping centers. They're usually staffed by nurse practitioners or physician assistants that have been trained to treat common illnesses and complaints.  They're usually fairly quick and inexpensive. However, if you have serious medical issues or chronic medical problems, these are probably not your best option.  No Primary Care Doctor: - Call Health Connect at  [phone] - they can help you locate a primary care doctor that  accepts your insurance, provides certain services, etc. - Physician Referral Service- [phone]  Chronic Pain Problems: Organization         Address  Phone   Notes  [facility]  [phone] Patients need to be referred by their primary care doctor.   Medication Assistance: Organization         Address  Phone   Notes  [facility] Medication [facility] [address] [place] [place], [place] [address] [phone] --Must be a resident of [facility] -- Must have NO insurance coverage whatsoever (no Medicaid/ Medicare,  etc.) -- The pt. MUST have a primary care doctor that directs their care regularly and follows them in the community   [facility]  [phone]   [employer]  [phone]    Agencies that provide inexpensive medical care: Organization         Address  Phone   Notes  [facility]  [phone]   [facility]    [phone]   [facility] [address] [place], [place] [address] [phone]   [facility] [address] [place] [place] [phone]   Planned Parenthood    [phone]   [facility]    [phone]   [facility]  [address], [place] Phone:  [phone], Fax:  [phone] Hours of Operation:  9 am - 6 pm, M-F.  Also accepts Medicaid/Medicare and self-pay.  [facility]  [address], [place] Phone: [phone], Fax: [phone]. Hours of Operation:  8:30 am - 5:30 pm, M-F.  Also accepts Medicaid and self-pay.  [facility] [place] [address], [place] Phone: [phone]   [facility] [address] Natasha Bence [place], [place] [phone], Ext. [phone] Mondays & Thursdays: 7-9 AM.  First 15 patients are seen on a first come, first serve basis.    Medicaid-accepting [facility] Providers:  Organization         Address  Phone   Notes  [facility] [address], [place] [phone] Also accepts self-pay patients.  [facility] [address] Laurell Josephs [place], [place]  [phone]   [facility] [address], [place] [phone]   [facility] [address] [place] [phone]   Renaye Rakers [address], [place]   [phone] Only accepts [place] patients after they have their name applied to their card.   Self-Pay (no  insurance) in [facility]:  Organization         Address  Phone   Notes  Sickle Cell Patients, [facility] [address] [place], [place] [phone]   [facility] [address] [place], [place] [phone]   [facility]  [address], [place] [phone]   [facility]/Dr. Osei-Bonsu  [address], [place] or [address], [place] [phone])  [phone] Phone number for both [facility] and [place] locations is the same.  [facility] [address] [place] [phone]   [facility] [address], [place] or [address] [phone]   [facility] [address], [place] [phone], phone; [phone], fax Sees patients 1st and 3rd Saturday of every month.  Must not qualify for public or private insurance (i.e. Medicaid, Medicare, [place] Health Choice, Veterans' Benefits)  Household income should be no more than 200% of the poverty level The clinic cannot treat you if you are pregnant or think you are pregnant  Sexually transmitted diseases are not treated at the clinic.    Dental Care: Organization         Address  Phone  Notes  [facility] Department of [facility] [address] [place], [place] [phone] Accepts children up to age 22 who are enrolled in [place] or [place] Health Choice; pregnant women with a Medicaid card; and children who have applied for Medicaid or [place] Health Choice, but were declined, whose parents can pay a reduced fee at time of service.  [facility] Department of [facility]  [address], [place] [phone] Accepts children up to age 74 who are enrolled in [place] or [place] Health Choice; pregnant women with a Medicaid card; and children who have applied for Medicaid or [place] Health Choice, but were  declined, whose parents can pay a reduced fee at time of service.  [facility]  [address] [place], [place] [phone] Patients are seen by appointment only. Walk-ins are not accepted. [facility] will see patients 56 years of age and older. Monday - Tuesday (8am-5pm) Most Wednesdays (8:30-5pm) $30 per visit, cash only  [facility] Adult Dental Access [facility]  [address], [facility] [phone] Patients are seen by appointment only. Walk-ins are not accepted. [facility] will see patients 25 years of age and older. One Wednesday Evening (Monthly: Volunteer Based).  $30 per visit, cash only  [employer]  [phone] for adults; Children under age 32, call Graduate Pediatric Dentistry at [phone]. Children aged 80-14, please call [phone] to request a pediatric application.  Dental services are provided in all areas of dental care including fillings, crowns and bridges, complete and partial dentures, implants, gum treatment, root canals, and extractions. Preventive care is also provided. Treatment is provided to both adults and children. Patients are selected via a lottery and there is often a waiting list.   [facility] [address], [place]  [phone] [email or web address]   [facility] [address] [place], [place] [phone], Ext. [phone] Second and Fourth Thursday of each month, opens at 6:30 AM; Clinic ends at 9 AM.  Patients are seen on a first-come first-served basis, and a limited number are seen during each clinic.   [facility]  [address] Ether Griffins [place], [place] [phone]   Eligibility Requirements You must have lived in [place], [place], or [place] counties for at least the last three months.   You cannot be eligible for state or federal sponsored [place], including [employer], [place], or [employer].   You generally cannot be  eligible for healthcare insurance through your employer.    How to apply: Eligibility screenings are held every Tuesday and Wednesday afternoon from  1:00 pm until 4:00 pm. You do not need an appointment for the interview!  [facility] [address], [place], [place] [phone]   [facility]  [phone]   [facility]  [phone]   [facility]  [phone]    Behavioral Health Resources in the Community: Intensive Outpatient Programs Organization         Address  Phone  Notes  [facility] [address], [place], [place] [phone]   [facility] [address], [place], [place] [phone]   [facility] [address], [place], [place]  [phone]   [facility] [address],  [place], [place] [phone] or [phone]   Substance Abuse Resources Organization         Address  Phone  Notes  [facility]  [phone]   [facility]  [phone]   [facility]  [phone]   [facility]  [phone]   Residential & Outpatient Substance Abuse Program  [phone]   Psychological Services Organization         Address  Phone  Notes  [facility]  [phone]- [phone]   [facility]  [phone]- [phone]   [facility] [address], [place] [phone] or [phone]    Mobile Crisis Teams Organization         Address  Phone  Notes  [facility], Mobile Crisis Care Unit  [phone]   [facility]  [address] [place], [place] [phone]   Doristine LocksSharon DeEsch [address] [place] [phone]    Self-Help/Support Groups Organization         Address  Phone             Notes  [facility] [place] - variety of support groups  336- [phone] Call for more information    Narcotics Anonymous (NA), [facility] [address], [place]  2 meetings at this location   Statisticianesidential Treatment Programs Organization         Address  Phone  Notes  [facility] [address],    [place]  [phone]   [facility]  [address], [place] [phone], [place], [place] [phone]   [facility] [address], [place] [phone] Admissions: 8am-3pm M-F  [facility] [address]    [place], [place] [phone]   [facility] [address], [place], [place] [phone]   [facility] [address]  [place], [place] [phone]   [facility] - Intensive Outpatient [address], [place], [place] [phone]   [facility] ([facility] [address]  [place], [place] [phone] or [phone]   [facility] ([facility]) [address] [place], [place] [phone] Accepts Medicaid  [facility] [address]  [place] [phone] Substance Abuse/Addiction Treatment   [facility] Resources Organization         Address  Phone  Notes  [facility]  [phone]) [phone]   Angie FavaJulie Brannon, PhD [address] [place], [place]   [phone] or [phone]) [phone]   [facility]   [address] [place], [place] [phone]   [facility] [address], [place], [place] [phone]) [phone] Insurance/Medicaid/sponsorship through [employer] [facility]  Faith and Families [address]                                    [place], [place] [phone] Therapy/tele-psych/case  [facility] [address]   [place], [place] [phone]    Dr. Lolly Mustache  [phone]   [facility] [place]  [employer] [facility] Dept. 1) [address], [place] 2) [address] [place] 3)  [address], [place] [phone]  [phone]   [facility] Child Abuse Hotline [phone] or [phone] (After Hours)

## 2015-04-08 NOTE — ED Notes (Addendum)
Pt from home with c/o RLQ pain since this past Sunday with a "knot."  Pt states she believes she's constipated, reports nausea this am.  Denies V/D.  Pt reports hx of the same this past Feb.  Pt in NAD, A&O.

## 2015-07-19 DIAGNOSIS — K581 Irritable bowel syndrome with constipation: Secondary | ICD-10-CM | POA: Insufficient documentation

## 2016-12-01 DIAGNOSIS — R7303 Prediabetes: Secondary | ICD-10-CM | POA: Insufficient documentation

## 2017-06-29 ENCOUNTER — Ambulatory Visit (INDEPENDENT_AMBULATORY_CARE_PROVIDER_SITE_OTHER): Payer: Self-pay | Admitting: Orthopedic Surgery

## 2018-04-01 ENCOUNTER — Ambulatory Visit (INDEPENDENT_AMBULATORY_CARE_PROVIDER_SITE_OTHER): Payer: BLUE CROSS/BLUE SHIELD | Admitting: Orthopedic Surgery

## 2018-04-01 ENCOUNTER — Ambulatory Visit (INDEPENDENT_AMBULATORY_CARE_PROVIDER_SITE_OTHER): Payer: BLUE CROSS/BLUE SHIELD

## 2018-04-01 ENCOUNTER — Encounter (INDEPENDENT_AMBULATORY_CARE_PROVIDER_SITE_OTHER): Payer: Self-pay | Admitting: Orthopedic Surgery

## 2018-04-01 ENCOUNTER — Ambulatory Visit (INDEPENDENT_AMBULATORY_CARE_PROVIDER_SITE_OTHER): Payer: Self-pay

## 2018-04-01 DIAGNOSIS — G8929 Other chronic pain: Secondary | ICD-10-CM | POA: Diagnosis not present

## 2018-04-01 DIAGNOSIS — M25562 Pain in left knee: Secondary | ICD-10-CM

## 2018-04-01 DIAGNOSIS — M25561 Pain in right knee: Secondary | ICD-10-CM | POA: Diagnosis not present

## 2018-04-01 DIAGNOSIS — M1712 Unilateral primary osteoarthritis, left knee: Secondary | ICD-10-CM | POA: Diagnosis not present

## 2018-04-01 MED ORDER — METHYLPREDNISOLONE ACETATE 40 MG/ML IJ SUSP
40.0000 mg | INTRAMUSCULAR | Status: AC | PRN
  Administered 2018-04-01: 40 mg via INTRA_ARTICULAR

## 2018-04-01 MED ORDER — TRAMADOL HCL 50 MG PO TABS: 50.0000 mg | ORAL_TABLET | Freq: Two times a day (BID) | ORAL | 0 refills | Status: DC | PRN

## 2018-04-01 MED ORDER — BUPIVACAINE HCL 0.25 % IJ SOLN
4.0000 mL | INTRAMUSCULAR | Status: AC | PRN
  Administered 2018-04-01: 4 mL via INTRA_ARTICULAR

## 2018-04-01 MED ORDER — LIDOCAINE HCL 1 % IJ SOLN
5.0000 mL | INTRAMUSCULAR | Status: AC | PRN
  Administered 2018-04-01: 5 mL

## 2018-04-01 NOTE — Progress Notes (Signed)
Office Visit Note   Patient: Amy [name]           Date of Birth: 1965/11/29           MRN: [id number] Visit Date: 04/01/2018 Requested by: No referring provider defined for this encounter. PCP: Bosie Clos, MD  Subjective: Chief Complaint  Patient presents with  . Left Knee - Pain  . Right Knee - Pain    HPI: Patient presents with bilateral knee pain left worse than right.  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  She describes chronic pain.  Takes over-the-counter medication.  She reports burning.  Right knee also hurts but the left knee has been hurting for a year.  She describes global pain and is not really more on one side than the other.  Tried Aleve and Advil without success.  No prior injury or surgery to either knee.              ROS: All systems reviewed are negative as they relate to the chief complaint within the history of present illness.  Patient denies  fevers or chills.   Assessment & Plan: Visit Diagnoses:  1. Chronic pain of both knees   2. Chronic pain of left knee     Plan: Impression is bilateral knee arthritis right worse than left radiographically but left worse than right symptomatically.  The left knee does still have some joint space maintained on the medial side.  The real question here is whether or not any type of arthroscopic intervention would be helpful for that left knee or if she is just going to have to wait this out until she gets a knee replacement.  MRI would help [place] to answer that question.  Also would like to do a cortisone injection into that left knee today for pain relief.  Tramadol also prescribed a one-time dose only.  I will see her back after the MRI scan.  Follow-Up Instructions: Return for after MRI.   Orders:  Orders Placed This Encounter  Procedures  . XR KNEE 3 VIEW RIGHT  . XR Knee 1-2 Views Left  . MR Knee Left w/o contrast   Meds ordered this encounter  Medications    . traMADol (ULTRAM) 50 MG tablet    Sig: Take 1 tablet (50 mg total) by mouth every 12 (twelve) hours as needed.    Dispense:  35 tablet    Refill:  0      Procedures: Large Joint Inj: L knee on 04/01/2018 8:09 PM Indications: diagnostic evaluation, joint swelling and pain Details: 18 G 1.5 in needle, superolateral approach  Arthrogram: No  Medications: 5 mL lidocaine 1 %; 40 mg methylPREDNISolone acetate 40 MG/ML; 4 mL bupivacaine 0.25 % Outcome: tolerated well, no immediate complications Procedure, treatment alternatives, risks and benefits explained, specific risks discussed. Consent was given by the patient. Immediately prior to procedure a time out was called to verify the correct patient, procedure, equipment, support staff and site/side marked as required. Patient was prepped and draped in the usual sterile fashion.       Clinical Data: No additional findings.  Objective: Vital Signs: There were no vitals taken for this visit.  Physical Exam:   Constitutional: Patient appears well-developed HEENT:  Head: Normocephalic Eyes:EOM are normal Neck: Normal range of motion Cardiovascular: Normal rate Pulmonary/chest: Effort normal Neurologic: Patient is alert Skin: Skin is warm Psychiatric: Patient has  normal mood and affect    Ortho Exam: Ortho exam demonstrates full active and passive range of motion of both knees.  No effusion in either knee.  Collateral crucial ligaments are stable.  No groin pain with internal/external rotation of either leg.  Pedal pulses palpable.  There is some medial greater than lateral joint line tenderness.  No other masses lymphadenopathy or skin changes noted in the knee region.  Specialty Comments:  No specialty comments available.  Imaging: Xr Knee 1-2 Views Left  Result Date: 04/01/2018 AP lateral merchant left knee reviewed.  Slight varus alignment present.  Medial compartment arthritis is noted which is moderate.  Patellofemoral  and lateral compartments appear to be spared.  No fracture or dislocation present.  Xr Knee 3 View Right  Result Date: 04/01/2018 AP lateral merchant right knee reviewed.  Moderate to severe medial compartment arthritis is present with mild varus alignment.  Bone-on-bone changes noted.  Patellofemoral and lateral compartments appear to be relatively spared.  No fracture or dislocation present    PMFS History: There are no active problems to display for this patient.  Past Medical History:  Diagnosis Date  . Hypertension     History reviewed. No pertinent family history.  Past Surgical History:  Procedure Laterality Date  . ABDOMINAL HYSTERECTOMY     Social History   Occupational History  . Not on file  Tobacco Use  . Smoking status: Never Smoker  . Smokeless tobacco: Never Used  Substance and Sexual Activity  . Alcohol use: No  . Drug use: No  . Sexual activity: Yes    Birth control/protection: Surgical

## 2018-04-03 ENCOUNTER — Telehealth (INDEPENDENT_AMBULATORY_CARE_PROVIDER_SITE_OTHER): Payer: Self-pay | Admitting: Orthopedic Surgery

## 2018-04-03 NOTE — Telephone Encounter (Signed)
IC pharm with Rx, LMVM, IC patient and advised.

## 2018-04-03 NOTE — Telephone Encounter (Signed)
PT called stating that her pharmacy doesn't have her prescription for Tramadol 50 mg that was called in on Dec 2nd.

## 2018-04-03 NOTE — Telephone Encounter (Signed)
It is okay to leave a detailed message on the VM Pt # [phone]

## 2018-04-13 ENCOUNTER — Ambulatory Visit
Admission: RE | Admit: 2018-04-13 | Discharge: 2018-04-13 | Disposition: A | Discharge status: Home / Self Care | Payer: BLUE CROSS/BLUE SHIELD | Source: Ambulatory Visit | Attending: Orthopedic Surgery | Admitting: Orthopedic Surgery

## 2018-04-13 DIAGNOSIS — M25562 Pain in left knee: Principal | ICD-10-CM

## 2018-04-13 DIAGNOSIS — G8929 Other chronic pain: Secondary | ICD-10-CM

## 2018-05-24 ENCOUNTER — Encounter (INDEPENDENT_AMBULATORY_CARE_PROVIDER_SITE_OTHER): Payer: Self-pay | Admitting: Orthopedic Surgery

## 2018-05-24 ENCOUNTER — Ambulatory Visit (INDEPENDENT_AMBULATORY_CARE_PROVIDER_SITE_OTHER): Payer: BLUE CROSS/BLUE SHIELD | Admitting: Orthopedic Surgery

## 2018-05-24 DIAGNOSIS — M1712 Unilateral primary osteoarthritis, left knee: Secondary | ICD-10-CM

## 2018-05-24 MED ORDER — TRAMADOL HCL 50 MG PO TABS: 50.0000 mg | ORAL_TABLET | Freq: Three times a day (TID) | ORAL | 0 refills | Status: DC

## 2018-05-26 ENCOUNTER — Encounter (INDEPENDENT_AMBULATORY_CARE_PROVIDER_SITE_OTHER): Payer: Self-pay | Admitting: Orthopedic Surgery

## 2018-05-26 NOTE — Progress Notes (Signed)
Office Visit Note   Patient: Amy [name]           Date of Birth: 1965-12-22           MRN: [id number] Visit Date: 05/24/2018 Requested by: Bosie Clos, MD [address] [place], [place] [address] PCP: Bosie Clos, MD  Subjective: Chief Complaint  Patient presents with  . Left Knee - Follow-up  . Results    HPI: Amy [name] is a patient with left knee pain.  Since of seen her she is had an MRI scan of that knee.  It does show medial compartment arthritis.  The ACL is intact and the patellofemoral and lateral compartments are relatively spared.  She does standing work as a Conservation officer, nature.  Been taking tramadol for pain which helps some.  She does have pain on a daily basis as well as pain which limits her walking endurance and standing endurance.  No family or personal history of DVT or pulmonary embolism.  She has failed conservative management including injection and exercise program.              ROS: All systems reviewed are negative as they relate to the chief complaint within the history of present illness.  Patient denies  fevers or chills.   Assessment & Plan: Visit Diagnoses:  1. Unilateral primary osteoarthritis, left knee     Plan: Impression is left knee pain in the relatively young patient who has fairly severe arthritis in that medial compartment.  She does have medial sided pain primarily but some pain on the anterior lateral side as well.  This pain is not severe.  I would say majority of her symptoms are on that medial side.  Discussed with her at length today treatment options including observation racing further injections partial knee replacement versus total knee replacement.  In general based on her MRI scan I think medial compartment knee replacement would give her relief.  Possible that this would eventually have to be changed over to total knee replacement.  She does have edema on both sides of the medial joint.  Patient understands the risk and benefits of  surgery including but not limited to incomplete pain relief knee stiffness high likelihood of revision in her lifetime as well as the time out of work.  All questions answered.  Follow-Up Instructions: No follow-ups on file.   Orders:  No orders of the defined types were placed in this encounter.  Meds ordered this encounter  Medications  . traMADol (ULTRAM) 50 MG tablet    Sig: Take 1 tablet (50 mg total) by mouth 3 (three) times daily.    Dispense:  90 tablet    Refill:  0      Procedures: No procedures performed   Clinical Data: No additional findings.  Objective: Vital Signs: There were no vitals taken for this visit.  Physical Exam:   Constitutional: Patient appears well-developed HEENT:  Head: Normocephalic Eyes:EOM are normal Neck: Normal range of motion Cardiovascular: Normal rate Pulmonary/chest: Effort normal Neurologic: Patient is alert Skin: Skin is warm Psychiatric: Patient has normal mood and affect    Ortho Exam: Ortho exam demonstrates full active and passive range of motion of that left knee with stable collateral cruciate ligaments.  Extensor mechanism is intact.  ACL is stable.  There is medial greater than lateral joint line tenderness and fairly mild and symmetric patellofemoral crepitus.  Specialty Comments:  No specialty comments available.  Imaging: No results found.   PMFS  History: There are no active problems to display for this patient.  Past Medical History:  Diagnosis Date  . Hypertension     History reviewed. No pertinent family history.  Past Surgical History:  Procedure Laterality Date  . ABDOMINAL HYSTERECTOMY     Social History   Occupational History  . Not on file  Tobacco Use  . Smoking status: Never Smoker  . Smokeless tobacco: Never Used  Substance and Sexual Activity  . Alcohol use: No  . Drug use: No  . Sexual activity: Yes    Birth control/protection: Surgical

## 2018-05-27 ENCOUNTER — Other Ambulatory Visit (INDEPENDENT_AMBULATORY_CARE_PROVIDER_SITE_OTHER): Payer: Self-pay | Admitting: Orthopedic Surgery

## 2018-05-27 DIAGNOSIS — M1712 Unilateral primary osteoarthritis, left knee: Secondary | ICD-10-CM

## 2018-06-20 NOTE — Progress Notes (Addendum)
PCP - Montey Hora, MD Cardiologist - pt denies  Chest x-ray - pt denies past year, no recent respiratory infections/complications EKG - 06/21/2018 at PAT appt  Stress Test -  Pt denies ECHO - pt denies  Cardiac Cath - pt denies  Sleep Study - NO CPAP - n/a  Fasting Blood Sugar - n/a Checks Blood Sugar _____ times a day-n/a  Blood Thinner Instructions: n/a Aspirin Instructions: n/a  Anesthesia review: pending labs  Patient denies shortness of breath, fever, cough and chest pain at PAT appointment  Patient verbalized understanding of instructions that were given to them at the PAT appointment. Patient was also instructed that they will need to review over the PAT instructions again at home before surgery.

## 2018-06-20 NOTE — Pre-Procedure Instructions (Signed)
Amy [name]  06/20/2018      [facility] - [place], [place] - [address] [place] [address] Phone: [phone] Fax: [phone]    Your procedure is scheduled on June 27, 2018.  Report to [facility] Entrance "A" at 645 AM.  Call this number if you have problems the morning of surgery:  [phone]   Remember:  Do not eat or drink after midnight.    Take these medicines the morning of surgery with A SIP OF WATER  Amlodipine (norvasc) loratadine (claritin)-if needed Tramadol (Ultram)-if needed  7 days prior to surgery STOP taking any diclofenac (Voltaren), Aspirin (unless otherwise instructed by your surgeon), Aleve, Naproxen, Ibuprofen, Motrin, Advil, Goody's, BC's, all herbal medications, fish oil, and all vitamins    Do not wear jewelry, make-up or nail polish.  Do not wear lotions, powders, or perfumes, or deodorant.  Do not shave 48 hours prior to surgery.    Do not bring valuables to the hospital.  [facility] is not responsible for any belongings or valuables.  Contacts, dentures or bridgework may not be worn into surgery.  Leave your suitcase in the car.  After surgery it may be brought to your room.  For patients admitted to the hospital, discharge time will be determined by your treatment team.  Patients discharged the day of surgery will not be allowed to drive home.    [facility]- Preparing For Surgery  Before surgery, you can play an important role. Because skin is not sterile, your skin needs to be as free of germs as possible. You can reduce the number of germs on your skin by washing with CHG (chlorahexidine gluconate) Soap before surgery.  CHG is an antiseptic cleaner which kills germs and bonds with the skin to continue killing germs even after washing.    Oral Hygiene is also important to reduce your risk of infection.  Remember - BRUSH YOUR TEETH THE MORNING OF SURGERY WITH YOUR REGULAR  TOOTHPASTE  Please do not use if you have an allergy to CHG or antibacterial soaps. If your skin becomes reddened/irritated stop using the CHG.  Do not shave (including legs and underarms) for at least 48 hours prior to first CHG shower. It is OK to shave your face.  Please follow these instructions carefully.   1. Shower the NIGHT BEFORE SURGERY and the MORNING OF SURGERY with CHG.   2. If you chose to wash your hair, wash your hair first as usual with your normal shampoo.  3. After you shampoo, rinse your hair and body thoroughly to remove the shampoo.  4. Use CHG as you would any other liquid soap. You can apply CHG directly to the skin and wash gently with a scrungie or a clean washcloth.   5. Apply the CHG Soap to your body ONLY FROM THE NECK DOWN.  Do not use on open wounds or open sores. Avoid contact with your eyes, ears, mouth and genitals (private parts). Wash Face and genitals (private parts)  with your normal soap.  6. Wash thoroughly, paying special attention to the area where your surgery will be performed.  7. Thoroughly rinse your body with warm water from the neck down.  8. DO NOT shower/wash with your normal soap after using and rinsing off the CHG Soap.  9. Pat yourself dry with a CLEAN TOWEL.  10. Wear CLEAN PAJAMAS to bed the night before surgery, wear comfortable clothes the  morning of surgery  11. Place CLEAN SHEETS on your bed the night of your first shower and DO NOT SLEEP WITH PETS.  Day of Surgery:  Do not apply any deodorants/lotions.  Please wear clean clothes to the hospital/surgery center.   Remember to brush your teeth WITH YOUR REGULAR TOOTHPASTE.  Please read over the following fact sheets that you were given.

## 2018-06-21 ENCOUNTER — Encounter (HOSPITAL_COMMUNITY): Payer: Self-pay

## 2018-06-21 ENCOUNTER — Other Ambulatory Visit: Payer: Self-pay

## 2018-06-21 ENCOUNTER — Encounter (HOSPITAL_COMMUNITY)
Admission: RE | Admit: 2018-06-21 | Discharge: 2018-06-21 | Disposition: A | Discharge status: Home / Self Care | Payer: BLUE CROSS/BLUE SHIELD | Source: Ambulatory Visit | Attending: Orthopedic Surgery | Admitting: Orthopedic Surgery

## 2018-06-21 DIAGNOSIS — Z01818 Encounter for other preprocedural examination: Secondary | ICD-10-CM | POA: Diagnosis not present

## 2018-06-21 HISTORY — DX: Unspecified osteoarthritis, unspecified site: M19.90

## 2018-06-21 LAB — CBC
HCT: 39.8 % (ref 36.0–46.0)
Hemoglobin: 12.9 g/dL (ref 12.0–15.0)
MCH: 32.3 pg (ref 26.0–34.0)
MCHC: 32.4 g/dL (ref 30.0–36.0)
MCV: 99.5 fL (ref 80.0–100.0)
Platelets: 305 10*3/uL (ref 150–400)
RBC: 4 MIL/uL (ref 3.87–5.11)
RDW: 12.1 % (ref 11.5–15.5)
WBC: 6 10*3/uL (ref 4.0–10.5)
nRBC: 0 % (ref 0.0–0.2)

## 2018-06-21 LAB — URINALYSIS, ROUTINE W REFLEX MICROSCOPIC
Bilirubin Urine: NEGATIVE
Glucose, UA: NEGATIVE mg/dL
Hgb urine dipstick: NEGATIVE
Ketones, ur: NEGATIVE mg/dL
Nitrite: NEGATIVE
Protein, ur: NEGATIVE mg/dL
SPECIFIC GRAVITY, URINE: 1.015 (ref 1.005–1.030)
pH: 7 (ref 5.0–8.0)

## 2018-06-21 LAB — BASIC METABOLIC PANEL
Anion gap: 9 (ref 5–15)
BUN: 12 mg/dL (ref 6–20)
CALCIUM: 9.3 mg/dL (ref 8.9–10.3)
CO2: 22 mmol/L (ref 22–32)
Chloride: 108 mmol/L (ref 98–111)
Creatinine, Ser: 0.74 mg/dL (ref 0.44–1.00)
GFR calc Af Amer: >60 mL/min (ref >60)
Glucose, Bld: 92 mg/dL (ref 70–99)
Potassium: 3.9 mmol/L (ref 3.5–5.1)
Sodium: 139 mmol/L (ref 135–145)

## 2018-06-21 LAB — SURGICAL PCR SCREEN
MRSA, PCR: NEGATIVE
Staphylococcus aureus: NEGATIVE

## 2018-06-22 LAB — URINE CULTURE

## 2018-06-27 ENCOUNTER — Encounter (HOSPITAL_COMMUNITY)
Admission: RE | Disposition: A | Discharge status: Home / Self Care | Payer: Self-pay | Source: Home / Self Care | Attending: Orthopedic Surgery

## 2018-06-27 ENCOUNTER — Encounter (HOSPITAL_COMMUNITY): Payer: Self-pay

## 2018-06-27 ENCOUNTER — Ambulatory Visit (HOSPITAL_COMMUNITY): Payer: BLUE CROSS/BLUE SHIELD | Admitting: Certified Registered Nurse Anesthetist

## 2018-06-27 ENCOUNTER — Other Ambulatory Visit: Payer: Self-pay

## 2018-06-27 ENCOUNTER — Observation Stay (HOSPITAL_COMMUNITY)
Admission: RE | Admit: 2018-06-27 | Discharge: 2018-06-28 | Disposition: A | Discharge status: Home / Self Care | Payer: BLUE CROSS/BLUE SHIELD | Attending: Orthopedic Surgery | Admitting: Orthopedic Surgery

## 2018-06-27 DIAGNOSIS — I10 Essential (primary) hypertension: Secondary | ICD-10-CM | POA: Insufficient documentation

## 2018-06-27 DIAGNOSIS — M1712 Unilateral primary osteoarthritis, left knee: Principal | ICD-10-CM

## 2018-06-27 DIAGNOSIS — Z79899 Other long term (current) drug therapy: Secondary | ICD-10-CM | POA: Insufficient documentation

## 2018-06-27 DIAGNOSIS — M1710 Unilateral primary osteoarthritis, unspecified knee: Secondary | ICD-10-CM | POA: Diagnosis present

## 2018-06-27 HISTORY — PX: KNEE ARTHROPLASTY: SHX992

## 2018-06-27 HISTORY — PX: MEDIAL PARTIAL KNEE REPLACEMENT: SHX5965

## 2018-06-27 SURGERY — MEDIAL PARTIAL KNEE REPLACEMENT
Anesthesia: Spinal | Laterality: Left

## 2018-06-27 MED ORDER — SODIUM CHLORIDE 0.9 % IR SOLN
Status: DC | PRN
  Administered 2018-06-27: 3000 mL

## 2018-06-27 MED ORDER — PROPOFOL 10 MG/ML IV BOLUS
INTRAVENOUS | Status: AC
  Filled 2018-06-27: qty 20

## 2018-06-27 MED ORDER — CEFAZOLIN SODIUM-DEXTROSE 2-4 GM/100ML-% IV SOLN
2.0000 g | Freq: Four times a day (QID) | INTRAVENOUS | Status: AC
  Administered 2018-06-27 (×2): 2 g via INTRAVENOUS
  Filled 2018-06-27 (×2): qty 100

## 2018-06-27 MED ORDER — PROPOFOL 500 MG/50ML IV EMUL
INTRAVENOUS | Status: DC | PRN
  Administered 2018-06-27: 100 ug/kg/min via INTRAVENOUS

## 2018-06-27 MED ORDER — HYDROMORPHONE HCL 1 MG/ML IJ SOLN
0.5000 mg | INTRAMUSCULAR | Status: DC | PRN
  Administered 2018-06-28: 0.5 mg via INTRAVENOUS
  Filled 2018-06-27: qty 1

## 2018-06-27 MED ORDER — SODIUM CHLORIDE 0.9 % IV SOLN
INTRAVENOUS | Status: DC | PRN
  Administered 2018-06-27: 25 ug/min via INTRAVENOUS

## 2018-06-27 MED ORDER — PHENOL 1.4 % MT LIQD: 1.0000 | OROMUCOSAL | Status: DC | PRN

## 2018-06-27 MED ORDER — MORPHINE SULFATE (PF) 4 MG/ML IV SOLN
INTRAVENOUS | Status: DC | PRN
  Administered 2018-06-27: 8 mg via INTRAVENOUS

## 2018-06-27 MED ORDER — LACTATED RINGERS IV SOLN
INTRAVENOUS | Status: DC
  Administered 2018-06-27 (×2): via INTRAVENOUS

## 2018-06-27 MED ORDER — BUPIVACAINE HCL (PF) 0.5 % IJ SOLN
INTRAMUSCULAR | Status: AC
  Filled 2018-06-27: qty 30

## 2018-06-27 MED ORDER — 0.9 % SODIUM CHLORIDE (POUR BTL) OPTIME
TOPICAL | Status: DC | PRN
  Administered 2018-06-27: 1000 mL

## 2018-06-27 MED ORDER — OXYCODONE HCL 5 MG PO TABS
5.0000 mg | ORAL_TABLET | ORAL | Status: DC | PRN
  Administered 2018-06-27: 5 mg via ORAL
  Administered 2018-06-28 (×3): 10 mg via ORAL
  Filled 2018-06-27 (×3): qty 2
  Filled 2018-06-27: qty 1

## 2018-06-27 MED ORDER — LACTATED RINGERS IV SOLN: INTRAVENOUS | Status: AC

## 2018-06-27 MED ORDER — TRANEXAMIC ACID 1000 MG/10ML IV SOLN
INTRAVENOUS | Status: DC | PRN
  Administered 2018-06-27: 1000 mg via INTRAVENOUS

## 2018-06-27 MED ORDER — ONDANSETRON HCL 4 MG/2ML IJ SOLN
INTRAMUSCULAR | Status: DC | PRN
  Administered 2018-06-27: 4 mg via INTRAVENOUS

## 2018-06-27 MED ORDER — ASPIRIN 81 MG PO CHEW
81.0000 mg | CHEWABLE_TABLET | Freq: Two times a day (BID) | ORAL | Status: DC
  Administered 2018-06-27 – 2018-06-28 (×2): 81 mg via ORAL
  Filled 2018-06-27 (×2): qty 1

## 2018-06-27 MED ORDER — BUPIVACAINE HCL (PF) 0.25 % IJ SOLN
INTRAMUSCULAR | Status: DC | PRN
  Administered 2018-06-27: 10 mL
  Administered 2018-06-27: 20 mL

## 2018-06-27 MED ORDER — METHOCARBAMOL 1000 MG/10ML IJ SOLN
500.0000 mg | Freq: Four times a day (QID) | INTRAVENOUS | Status: DC | PRN
  Filled 2018-06-27: qty 5

## 2018-06-27 MED ORDER — DOCUSATE SODIUM 100 MG PO CAPS
100.0000 mg | ORAL_CAPSULE | Freq: Two times a day (BID) | ORAL | Status: DC
  Administered 2018-06-27 – 2018-06-28 (×2): 100 mg via ORAL
  Filled 2018-06-27 (×3): qty 1

## 2018-06-27 MED ORDER — MIDAZOLAM HCL 2 MG/2ML IJ SOLN
2.0000 mg | Freq: Once | INTRAMUSCULAR | Status: AC
  Administered 2018-06-27: 2 mg via INTRAVENOUS

## 2018-06-27 MED ORDER — BUPIVACAINE HCL (PF) 0.25 % IJ SOLN
INTRAMUSCULAR | Status: AC
  Filled 2018-06-27: qty 30

## 2018-06-27 MED ORDER — BUPIVACAINE LIPOSOME 1.3 % IJ SUSP
20.0000 mL | INTRAMUSCULAR | Status: AC
  Administered 2018-06-27: 20 mL
  Filled 2018-06-27: qty 20

## 2018-06-27 MED ORDER — PROMETHAZINE HCL 25 MG/ML IJ SOLN: 6.2500 mg | INTRAMUSCULAR | Status: DC | PRN

## 2018-06-27 MED ORDER — METHOCARBAMOL 500 MG PO TABS
500.0000 mg | ORAL_TABLET | Freq: Four times a day (QID) | ORAL | Status: DC | PRN
  Administered 2018-06-28: 500 mg via ORAL
  Filled 2018-06-27: qty 1

## 2018-06-27 MED ORDER — FENTANYL CITRATE (PF) 100 MCG/2ML IJ SOLN
INTRAMUSCULAR | Status: AC
  Administered 2018-06-27: 100 ug via INTRAVENOUS
  Filled 2018-06-27: qty 2

## 2018-06-27 MED ORDER — DEXAMETHASONE SODIUM PHOSPHATE 10 MG/ML IJ SOLN
INTRAMUSCULAR | Status: AC
  Filled 2018-06-27: qty 1

## 2018-06-27 MED ORDER — OXYCODONE HCL 5 MG/5ML PO SOLN: 5.0000 mg | Freq: Once | ORAL | Status: DC | PRN

## 2018-06-27 MED ORDER — GABAPENTIN 300 MG PO CAPS
300.0000 mg | ORAL_CAPSULE | Freq: Three times a day (TID) | ORAL | Status: DC
  Administered 2018-06-27 – 2018-06-28 (×3): 300 mg via ORAL
  Filled 2018-06-27 (×3): qty 1

## 2018-06-27 MED ORDER — CHLORHEXIDINE GLUCONATE 4 % EX LIQD: 60.0000 mL | Freq: Once | CUTANEOUS | Status: DC

## 2018-06-27 MED ORDER — OXYCODONE HCL 5 MG PO TABS: 5.0000 mg | ORAL_TABLET | Freq: Once | ORAL | Status: DC | PRN

## 2018-06-27 MED ORDER — TRANEXAMIC ACID-NACL 1000-0.7 MG/100ML-% IV SOLN
INTRAVENOUS | Status: AC
  Filled 2018-06-27: qty 100

## 2018-06-27 MED ORDER — ACETAMINOPHEN 325 MG PO TABS
325.0000 mg | ORAL_TABLET | Freq: Four times a day (QID) | ORAL | Status: DC | PRN
  Administered 2018-06-28: 650 mg via ORAL
  Filled 2018-06-27: qty 2

## 2018-06-27 MED ORDER — ONDANSETRON HCL 4 MG PO TABS: 4.0000 mg | ORAL_TABLET | Freq: Four times a day (QID) | ORAL | Status: DC | PRN

## 2018-06-27 MED ORDER — METOCLOPRAMIDE HCL 5 MG/ML IJ SOLN: 5.0000 mg | Freq: Three times a day (TID) | INTRAMUSCULAR | Status: DC | PRN

## 2018-06-27 MED ORDER — CEFAZOLIN SODIUM-DEXTROSE 2-4 GM/100ML-% IV SOLN
2.0000 g | INTRAVENOUS | Status: AC
  Administered 2018-06-27: 2 g via INTRAVENOUS
  Filled 2018-06-27: qty 100

## 2018-06-27 MED ORDER — DEXAMETHASONE SODIUM PHOSPHATE 10 MG/ML IJ SOLN
INTRAMUSCULAR | Status: DC | PRN
  Administered 2018-06-27: 5 mg via INTRAVENOUS

## 2018-06-27 MED ORDER — HYDROMORPHONE HCL 1 MG/ML IJ SOLN: 0.2500 mg | INTRAMUSCULAR | Status: DC | PRN

## 2018-06-27 MED ORDER — METOCLOPRAMIDE HCL 5 MG PO TABS: 5.0000 mg | ORAL_TABLET | Freq: Three times a day (TID) | ORAL | Status: DC | PRN

## 2018-06-27 MED ORDER — ONDANSETRON HCL 4 MG/2ML IJ SOLN
INTRAMUSCULAR | Status: AC
  Filled 2018-06-27: qty 2

## 2018-06-27 MED ORDER — ONDANSETRON HCL 4 MG/2ML IJ SOLN: 4.0000 mg | Freq: Four times a day (QID) | INTRAMUSCULAR | Status: DC | PRN

## 2018-06-27 MED ORDER — ROPIVACAINE HCL 5 MG/ML IJ SOLN
INTRAMUSCULAR | Status: DC | PRN
  Administered 2018-06-27: 20 mL via PERINEURAL

## 2018-06-27 MED ORDER — MORPHINE SULFATE (PF) 4 MG/ML IV SOLN
INTRAVENOUS | Status: AC
  Filled 2018-06-27: qty 2

## 2018-06-27 MED ORDER — MENTHOL 3 MG MT LOZG: 1.0000 | LOZENGE | OROMUCOSAL | Status: DC | PRN

## 2018-06-27 MED ORDER — MIDAZOLAM HCL 2 MG/2ML IJ SOLN
INTRAMUSCULAR | Status: AC
  Administered 2018-06-27: 2 mg via INTRAVENOUS
  Filled 2018-06-27: qty 2

## 2018-06-27 MED ORDER — CLONIDINE HCL (ANALGESIA) 100 MCG/ML EP SOLN: EPIDURAL | Status: DC | PRN

## 2018-06-27 MED ORDER — BUPIVACAINE HCL (PF) 0.75 % IJ SOLN
INTRAMUSCULAR | Status: DC | PRN
  Administered 2018-06-27: 1.8 mL

## 2018-06-27 MED ORDER — FENTANYL CITRATE (PF) 100 MCG/2ML IJ SOLN
100.0000 ug | Freq: Once | INTRAMUSCULAR | Status: AC
  Administered 2018-06-27: 100 ug via INTRAVENOUS

## 2018-06-27 MED ORDER — MIDAZOLAM HCL 2 MG/2ML IJ SOLN
INTRAMUSCULAR | Status: AC
  Filled 2018-06-27: qty 2

## 2018-06-27 MED ORDER — MIDAZOLAM HCL 2 MG/2ML IJ SOLN
INTRAMUSCULAR | Status: DC | PRN
  Administered 2018-06-27: 2 mg via INTRAVENOUS

## 2018-06-27 MED ORDER — CLONIDINE HCL (ANALGESIA) 100 MCG/ML EP SOLN
150.0000 ug | Freq: Once | EPIDURAL | Status: AC
  Administered 2018-06-27: 1 mL via INTRA_ARTICULAR
  Filled 2018-06-27: qty 10

## 2018-06-27 SURGICAL SUPPLY — 87 items
ALCOHOL 70% 16 OZ (MISCELLANEOUS) ×3 IMPLANT
ALCOHOL ISOPROPYL (RUBBING) (MISCELLANEOUS) ×3 IMPLANT
BANDAGE ACE 4X5 VEL STRL LF (GAUZE/BANDAGES/DRESSINGS) ×3 IMPLANT
BANDAGE ESMARK 6X9 LF (GAUZE/BANDAGES/DRESSINGS) ×1 IMPLANT
BEARING TIB MENIS OX UNI LT 4 (Knees) ×1 IMPLANT
BNDG COHESIVE 6X5 TAN STRL LF (GAUZE/BANDAGES/DRESSINGS) ×3 IMPLANT
BNDG ELASTIC 6X15 VLCR STRL LF (GAUZE/BANDAGES/DRESSINGS) ×3 IMPLANT
BNDG ESMARK 6X9 LF (GAUZE/BANDAGES/DRESSINGS) ×3
BOWL SMART MIX CTS (DISPOSABLE) ×3 IMPLANT
CEMENT BONE R 1X40 (Cement) ×3 IMPLANT
CHLORAPREP W/TINT 26ML (MISCELLANEOUS) ×6 IMPLANT
CLOSURE STERI-STRIP 1/2X4 (GAUZE/BANDAGES/DRESSINGS) ×1
CLSR STERI-STRIP ANTIMIC 1/2X4 (GAUZE/BANDAGES/DRESSINGS) ×2 IMPLANT
COVER SURGICAL LIGHT HANDLE (MISCELLANEOUS) ×3 IMPLANT
COVER WAND RF STERILE (DRAPES) ×3 IMPLANT
CUFF TOURNIQUET SINGLE 34IN LL (TOURNIQUET CUFF) ×3 IMPLANT
CUFF TOURNIQUET SINGLE 44IN (TOURNIQUET CUFF) IMPLANT
DECANTER SPIKE VIAL GLASS SM (MISCELLANEOUS) ×6 IMPLANT
DRAPE IMP U-DRAPE 54X76 (DRAPES) ×3 IMPLANT
DRAPE INCISE IOBAN 66X45 STRL (DRAPES) IMPLANT
DRAPE ORTHO SPLIT 77X108 STRL (DRAPES) ×6
DRAPE SURG ORHT 6 SPLT 77X108 (DRAPES) ×3 IMPLANT
DRAPE U-SHAPE 47X51 STRL (DRAPES) ×3 IMPLANT
DRESSING AQUACEL AG SP 3.5X10 (GAUZE/BANDAGES/DRESSINGS) ×1 IMPLANT
DRSG AQUACEL AG SP 3.5X10 (GAUZE/BANDAGES/DRESSINGS) ×3
DRSG PAD ABDOMINAL 8X10 ST (GAUZE/BANDAGES/DRESSINGS) ×6 IMPLANT
DURAPREP 26ML APPLICATOR (WOUND CARE) IMPLANT
ELECT REM PT RETURN 9FT ADLT (ELECTROSURGICAL) ×3
ELECTRODE REM PT RTRN 9FT ADLT (ELECTROSURGICAL) ×1 IMPLANT
FACESHIELD WRAPAROUND (MASK) ×3 IMPLANT
GAUZE SPONGE 4X4 12PLY STRL (GAUZE/BANDAGES/DRESSINGS) ×3 IMPLANT
GAUZE XEROFORM 5X9 LF (GAUZE/BANDAGES/DRESSINGS) ×3 IMPLANT
GLOVE BIOGEL PI IND STRL 6.5 (GLOVE) ×3 IMPLANT
GLOVE BIOGEL PI IND STRL 7.5 (GLOVE) ×1 IMPLANT
GLOVE BIOGEL PI IND STRL 8 (GLOVE) ×1 IMPLANT
GLOVE BIOGEL PI INDICATOR 6.5 (GLOVE) ×6
GLOVE BIOGEL PI INDICATOR 7.5 (GLOVE) ×2
GLOVE BIOGEL PI INDICATOR 8 (GLOVE) ×2
GLOVE SURG ORTHO 8.0 STRL STRW (GLOVE) ×3 IMPLANT
GLOVE SURG SS PI 6.5 STRL IVOR (GLOVE) ×6 IMPLANT
GLOVE SURG SS PI 7.0 STRL IVOR (GLOVE) ×6 IMPLANT
GOWN STRL REUS W/ TWL LRG LVL3 (GOWN DISPOSABLE) ×3 IMPLANT
GOWN STRL REUS W/ TWL XL LVL3 (GOWN DISPOSABLE) ×1 IMPLANT
GOWN STRL REUS W/TWL LRG LVL3 (GOWN DISPOSABLE) ×6
GOWN STRL REUS W/TWL XL LVL3 (GOWN DISPOSABLE) ×2
HANDPIECE INTERPULSE COAX TIP (DISPOSABLE) ×2
HOOD PEEL AWAY FACE SHEILD DIS (HOOD) ×6 IMPLANT
IMMOBILIZER KNEE 20 (SOFTGOODS) IMPLANT
IMMOBILIZER KNEE 22 UNIV (SOFTGOODS) ×3 IMPLANT
IMMOBILIZER KNEE 24 THIGH 36 (MISCELLANEOUS) IMPLANT
IMMOBILIZER KNEE 24 UNIV (MISCELLANEOUS)
KIT BASIN OR (CUSTOM PROCEDURE TRAY) ×3 IMPLANT
KIT TURNOVER KIT B (KITS) ×3 IMPLANT
KNEE PARTIAL CEMENT FEM XS (Miscellaneous) ×3 IMPLANT
MANIFOLD NEPTUNE II (INSTRUMENTS) ×3 IMPLANT
MENISCAL OX UNI EXSM LT 4THICK (Knees) ×3 IMPLANT
NEEDLE 18GX1X1/2 (RX/OR ONLY) (NEEDLE) ×3 IMPLANT
NEEDLE HYPO 22GX1.5 SAFETY (NEEDLE) ×6 IMPLANT
NEEDLE SPNL 18GX3.5 QUINCKE PK (NEEDLE) ×3 IMPLANT
NS IRRIG 1000ML POUR BTL (IV SOLUTION) ×6 IMPLANT
PACK BLADE SAW RECIP 70 3 PT (BLADE) ×3 IMPLANT
PACK TOTAL JOINT (CUSTOM PROCEDURE TRAY) ×3 IMPLANT
PACK UNIVERSAL I (CUSTOM PROCEDURE TRAY) ×3 IMPLANT
PAD ARMBOARD 7.5X6 YLW CONV (MISCELLANEOUS) ×6 IMPLANT
PAD CAST 4YDX4 CTTN HI CHSV (CAST SUPPLIES) ×1 IMPLANT
PADDING CAST COTTON 4X4 STRL (CAST SUPPLIES) ×2
PADDING CAST COTTON 6X4 STRL (CAST SUPPLIES) ×3 IMPLANT
SET HNDPC FAN SPRY TIP SCT (DISPOSABLE) ×1 IMPLANT
SOL PREP POV-IOD 4OZ 10% (MISCELLANEOUS) ×3 IMPLANT
SOLUTION BETADINE 4OZ (MISCELLANEOUS) ×3 IMPLANT
SPONGE LAP 18X18 RF (DISPOSABLE) IMPLANT
STAPLER VISISTAT 35W (STAPLE) IMPLANT
SUCTION FRAZIER HANDLE 10FR (MISCELLANEOUS) ×2
SUCTION TUBE FRAZIER 10FR DISP (MISCELLANEOUS) ×1 IMPLANT
SUT VIC AB 0 CTB1 27 (SUTURE) ×9 IMPLANT
SUT VIC AB 1 CT1 27 (SUTURE) ×10
SUT VIC AB 1 CT1 27XBRD ANBCTR (SUTURE) ×5 IMPLANT
SUT VIC AB 2-0 CT1 27 (SUTURE) ×4
SUT VIC AB 2-0 CT1 TAPERPNT 27 (SUTURE) ×2 IMPLANT
SYR 30ML LL (SYRINGE) ×3 IMPLANT
SYR TB 1ML LUER SLIP (SYRINGE) ×3 IMPLANT
TOWEL OR 17X24 6PK STRL BLUE (TOWEL DISPOSABLE) ×6 IMPLANT
TOWEL OR 17X26 10 PK STRL BLUE (TOWEL DISPOSABLE) ×6 IMPLANT
TRAY FOLEY MTR SLVR 16FR STAT (SET/KITS/TRAYS/PACK) IMPLANT
TRAY TIBIAL KNEE OXFORD STD AA (Joint) ×3 IMPLANT
TRAY URETHRAL FOLEY CATH 14FR (CATHETERS) ×3 IMPLANT
WATER STERILE IRR 1000ML POUR (IV SOLUTION) ×6 IMPLANT

## 2018-06-27 NOTE — Anesthesia Procedure Notes (Signed)
Spinal  Patient location during procedure: OR Start time: 06/27/2018 8:50 AM End time: 06/27/2018 8:55 AM Staffing Anesthesiologist: Lowella Curb, MD Performed: anesthesiologist  Preanesthetic Checklist Completed: patient identified, site marked, surgical consent, pre-op evaluation, timeout performed, IV checked, risks and benefits discussed and monitors and equipment checked Spinal Block Patient position: sitting Prep: DuraPrep Patient monitoring: heart rate, cardiac monitor, continuous pulse ox and blood pressure Approach: midline Location: L3-4 Injection technique: single-shot Needle Needle type: Sprotte  Needle gauge: 24 G Needle length: 9 cm Assessment Sensory level: T4

## 2018-06-27 NOTE — Progress Notes (Signed)
Orthopedic Tech Progress Note Patient Details:  Amy [name] [birth year] [id number]  Ortho Devices Type of Ortho Device: Bone foam zero knee Ortho Device/Splint Interventions: Ordered, Application, Adjustment   Post Interventions Patient Tolerated: Well Instructions Provided: Adjustment of device, Care of device   [name] [name] 06/27/2018, 12:39 PM

## 2018-06-27 NOTE — Brief Op Note (Signed)
06/27/2018  11:26 AM  PATIENT:  Amy [name]  53 y.o. female  PRE-OPERATIVE DIAGNOSIS:  left knee medial compartment osteoarthritis  POST-OPERATIVE DIAGNOSIS:  eft knee medial compartment osteoarthritis  PROCEDURE:  Procedure(s): LEFT KNEE PARTIAL REPLACEMENT  SURGEON:  Surgeon(s): Cammy Copa, MD  ASSISTANT: April Green, RNFA  ANESTHESIA:   spinal  EBL: 50 ml    Total I/O In: 1000 [I.V.:1000] Out: 1100 [Urine:1000; Blood:100]  BLOOD ADMINISTERED: none  DRAINS: none   LOCAL MEDICATIONS USED: Marcaine morphine clonidine Exparel  SPECIMEN:  No Specimen  COUNTS:  YES  TOURNIQUET:   Total Tourniquet Time Documented: Thigh (Left) - 82 minutes Total: Thigh (Left) - 82 minutes   DICTATION: .Other Dictation: Dictation Number [id number]  PLAN OF CARE: Admit for overnight observation  PATIENT DISPOSITION:  PACU - hemodynamically stable

## 2018-06-27 NOTE — H&P (Signed)
Amy [name] is an 53 y.o. female.   Chief Complaint: Left knee pain HPI: Amy [name] is a 53 year old patient with long history of left knee pain.  She is failed conservative measures including injection activity modification bracing and other measures.  She reports pain with prolonged standing and walking.  She does work as a Conservation officer, nature.  MRI scan shows medial compartment arthritis with sparing of the lateral and patellofemoral compartments.  Because of failure of conservative management she presents now for operative management partial knee replacement after explanation of risks and benefits.  No personal or family history of DVT or pulmonary embolism.  Past Medical History:  Diagnosis Date  . Arthritis   . Hypertension     Past Surgical History:  Procedure Laterality Date  . ABDOMINAL HYSTERECTOMY    . WISDOM TOOTH EXTRACTION      History reviewed. No pertinent family history. Social History:  reports that she has never smoked. She has never used smokeless tobacco. She reports that she does not drink alcohol or use drugs.  Allergies: No Known Allergies  Medications Prior to Admission  Medication Sig Dispense Refill  . amLODipine (NORVASC) 5 MG tablet Take 5 mg by mouth daily.    . Ascorbic Acid (VITAMIN C PO) Take 1 tablet by mouth daily as needed (immune support).    . diclofenac (VOLTAREN) 75 MG EC tablet Take 75 mg by mouth daily as needed for moderate pain.    Marland Kitchen docusate sodium (COLACE) 100 MG capsule Take 1 capsule (100 mg total) by mouth every 12 (twelve) hours. (Patient taking differently: Take 100 mg by mouth daily as needed for mild constipation. ) 60 capsule 0  . loratadine (CLARITIN) 10 MG tablet Take 10 mg by mouth daily as needed for allergies.    Marland Kitchen losartan (COZAAR) 100 MG tablet Take 100 mg by mouth daily.    . polyethylene glycol powder (GLYCOLAX/MIRALAX) powder Take 1 capful dissolved in 4-8 ounces of water/juice daily (Patient taking differently: Take 34 g by mouth every  evening. ) 255 g 0  . traMADol (ULTRAM) 50 MG tablet Take 1 tablet (50 mg total) by mouth 3 (three) times daily. (Patient taking differently: Take 50 mg by mouth 3 (three) times daily as needed for moderate pain. ) 90 tablet 0    No results found for this or any previous visit (from the past 48 hour(s)). No results found.  Review of Systems  Musculoskeletal: Positive for joint pain.  All other systems reviewed and are negative.   Blood pressure (!) 161/80, pulse 71, temperature 98.1 F (36.7 C), resp. rate 18, height 5\' 4"  (1.626 m), weight 81.6 kg, SpO2 96 %. Physical Exam  Constitutional: She appears well-developed.  HENT:  Head: Normocephalic.  Eyes: Pupils are equal, round, and reactive to light.  Neck: Normal range of motion.  Cardiovascular: Normal rate.  Respiratory: Effort normal.  Neurological: She is alert.  Skin: Skin is warm.  Psychiatric: She has a normal mood and affect.  Examination of the left knee demonstrates slight varus alignment with intact skin in the knee region.  Pedal pulses palpable.  ACL is stable.  Collateral ligaments have good stability.  Range of motion is easily past 90 degrees.  Patient has exclusively medial joint line tenderness to palpation.  Assessment/Plan Impression is left knee symptomatic medial compartment arthritis.  Plan is left knee unicompartmental knee replacement.  Risks and benefits are discussed including but not limited to infection nerve vessel damage incomplete pain relief as well  as potential need for more surgery in the future.  Patient understands and wishes to proceed.  All questions answered  Burnard Bunting, MD 06/27/2018, 7:36 AM

## 2018-06-27 NOTE — Anesthesia Preprocedure Evaluation (Signed)
Anesthesia Evaluation  Patient identified by MRN, date of birth, ID band Patient awake    Reviewed: Allergy & Precautions, NPO status , Patient's Chart, lab work & pertinent test results  Airway Mallampati: II  TM Distance: >3 FB Neck ROM: Full    Dental no notable dental hx.    Pulmonary neg pulmonary ROS,    Pulmonary exam normal breath sounds clear to auscultation       Cardiovascular hypertension, Pt. on medications negative cardio ROS Normal cardiovascular exam Rhythm:Regular Rate:Normal     Neuro/Psych negative neurological ROS  negative psych ROS   GI/Hepatic negative GI ROS, Neg liver ROS,   Endo/Other  negative endocrine ROS  Renal/GU negative Renal ROS  negative genitourinary   Musculoskeletal  (+) Arthritis , Osteoarthritis,    Abdominal (+) + obese,   Peds negative pediatric ROS (+)  Hematology negative hematology ROS (+)   Anesthesia Other Findings   Reproductive/Obstetrics negative OB ROS                             Anesthesia Physical Anesthesia Plan  ASA: II  Anesthesia Plan: Spinal   Post-op Pain Management:  Regional for Post-op pain   Induction: Intravenous  PONV Risk Score and Plan: 2 and Ondansetron and Midazolam  Airway Management Planned: Simple Face Mask  Additional Equipment:   Intra-op Plan:   Post-operative Plan:   Informed Consent: I have reviewed the patients History and Physical, chart, labs and discussed the procedure including the risks, benefits and alternatives for the proposed anesthesia with the patient or authorized representative who has indicated his/her understanding and acceptance.     Dental advisory given  Plan Discussed with: CRNA  Anesthesia Plan Comments:         Anesthesia Quick Evaluation

## 2018-06-27 NOTE — Op Note (Signed)
Amy, [name] MEDICAL RECORD [id number] ACCOUNT [id number] DATE OF BIRTH:03-Feb-1966 FACILITY: [facility] LOCATION: [facility] PHYSICIAN:Adriann Thau Diamantina Providence, MD  OPERATIVE REPORT  DATE OF PROCEDURE:  06/27/2018  PREOPERATIVE DIAGNOSIS:  Left knee medial compartment arthritis.  POSTOPERATIVE DIAGNOSIS:  Left knee medial compartment arthritis.  PROCEDURE:  Left knee unicompartmental knee replacement utilizing Biomet components, AA tibia size 4 meniscal bearing and extra small femur all cemented.  SURGEON:  Cammy Copa, MD  ASSISTANT:  April Green, RNFA.  INDICATIONS:  The patient is a 53 year old patient with severe right knee pain refractory to nonoperative management with medial compartment arthritis on x-ray.  She presents now for operative management after explanation of risks and benefits.  PROCEDURE IN DETAIL:  The patient was brought to the operating room where spinal anesthetic was induced.  Preoperative antibiotics administered.  Timeout was called.  Left leg prescrubbed with alcohol and Betadine, allowed to air dry, prepped with  ChloraPrep solution and draped in a sterile manner.  Ioban used to cover the operative field.  A timeout was called.  Leg was then prepped in a sterile manner draped with Ioban.  Leg was elevated, exsanguinated with Esmarch wrap.  Tourniquet was  inflated.  Anterior approach to knee was made.  Skin and subcutaneous tissue were sharply divided.  Median parapatellar approach was taken just to the superior pole of the patella.  Fat pad was excised.  Inspection of the knee demonstrated intact  patellofemoral compartment, intact lateral compartment with intact ACL and grade IV changes on the medial femoral condyle and medial tibial plateau.  At this time, the decision was made to proceed with partial knee replacement.  The femur was then sized  with the sizing spoon to extra small.  Then, the tibial jig was attached to the sizing spoon to make a cut on the  tibia.  This was made perpendicular to the mechanical axis of the tibia by extramedullary alignment.  The tibia was then cut first with the  sagittal cut and then with the axial cut.  Collaterals were protected.  The tibia was removed.  At this time, intramedullary alignment was then utilized in order to attach the femoral jig.  The midportion of the femur was marked.  Guide was placed on the  femur and was marked.  At this time, the femur was prepared.  The posterior chamfer cut was made, which was a fairly small sliver.  At this time, a 0 spigot was placed and reaming was performed.  Trial was placed in order to obtain sizing.  The best  size match was AA tibia and extra small femur.  A size 3/4 meniscal bearing fit well in flexion.  A 1 mm shim fit well in extension.  Then, 3 mm reaming was performed.  Once this additional reaming was performed, osteophytes were removed and trial  reduction was performed.  With the extra removal performed, the patient had very good stability with both the 3 and 4 spacer.  The tibia was then keel punched at this time.  Final preparation on the femur was performed for the superior aspect of bone  removal.  Trial components again placed with the keel being prepared.  Both the 3 and 4 mm spacer gave a very good motion and stability.  Trial components were removed.  Thorough irrigation performed.  Exparel used to anesthetize the capsule.  Marcaine,  morphine, clonidine used to anesthetize the skin.  The components were then cemented into position and a size 4 actually  was the better fit in terms of stability.  The size 3 was slightly loose after cementing.  At this time, excess cement was removed  and good stability of the implant was achieved.  It should be noted that both the tibial plateau and the femur were drilled in order to facilitate cement interdigitation.  The tourniquet was released.  Bleeding points encountered controlled using  electrocautery.  Thorough irrigation  again performed and the arthrotomy was then closed using #1 Vicryl suture followed by interrupted inverted 0 Vicryl suture, 2-0 Vicryl suture and a 3-0 Monocryl.  Steri-Strips and Aquacel dressing placed along with a  knee immobilizer.  The patient tolerated the procedure well without immediate complications.  He was transferred to the recovery room in stable condition.  TN/NUANCE  D:06/27/2018 T:06/27/2018 JOB:[id number]

## 2018-06-27 NOTE — Progress Notes (Signed)
Physical Therapy Evaluation Patient Details Name: Amy [name] MRN: [id number] DOB: [birth year] Today's Date: 06/27/2018   History of Present Illness  Patient is 53 y/o female s/p L partial knee replacement. PMH includes HTN and arthritis.   Clinical Impression  Patient admitted secondary to problems above and with deficits below. Patient tolerated session well. Patient ambulated with min guard and use of RW. Educated and reviewed HEP and knee precautions with patient. Patient will benefit from acute physical therapy to maximize independence and safety with functional mobility.      Follow Up Recommendations Follow surgeon's recommendation for DC plan and follow-up therapies    Equipment Recommendations  None recommended by PT    Recommendations for Other Services       Precautions / Restrictions Precautions Precautions: Knee Precaution Booklet Issued: Yes (comment) Precaution Comments: reviewed knee precautions with patient Required Braces or Orthoses: Knee Immobilizer - Left Knee Immobilizer - Left: Other (comment)(until discontinued) Restrictions Weight Bearing Restrictions: Yes LLE Weight Bearing: Weight bearing as tolerated      Mobility  Bed Mobility               General bed mobility comments: Patient on BSC on arrival  Transfers Overall transfer level: Needs assistance Equipment used: Rolling walker (2 wheeled) Transfers: Sit to/from [employer] Sit to Stand: Min guard Stand pivot transfers: Min guard       General transfer comment: Patient on BSC upon arrival. Required min guard for safety to complete stand pivot transfer with use of RW to sit EOB. Patient also required min guard for sit to stand transfer with use of RW. Verbal cues for hand placement when using RW prior to standing.   Ambulation/Gait Ambulation/Gait assistance: Min guard Gait Distance (Feet): 50 Feet Assistive device: Rolling walker (2 wheeled) Gait  Pattern/deviations: Step-to pattern;Decreased step length - right;Decreased stance time - left;Decreased weight shift to left;Antalgic Gait velocity: decreased Gait velocity interpretation: <1.8 ft/sec, indicate of risk for recurrent falls General Gait Details: Patient ambulated with min guard and use of RW for safety. Verbal cues to keep LLE straight throughout gait as patient prefered everted position. Verbal cues for sequencing with RW. No LOB or knee buckling noted.   Stairs            Wheelchair Mobility    Modified Rankin (Stroke Patients Only)       Balance Overall balance assessment: Needs assistance Sitting-balance support: No upper extremity supported;Feet supported Sitting balance-Leahy Scale: Good     Standing balance support: Bilateral upper extremity supported Standing balance-Leahy Scale: Poor Standing balance comment: reliant on BUE support to maintain standing balance                             Pertinent Vitals/Pain Pain Assessment: 0-10 Pain Score: 7  Pain Location: L knee Pain Descriptors / Indicators: Aching;Discomfort;Operative site guarding Pain Intervention(s): Limited activity within patient's tolerance;Monitored during session;Ice applied    Home Living Family/patient expects to be discharged to:: Private residence Living Arrangements: Parent Available Help at Discharge: Family;Available 24 hours/day Type of Home: House Home Access: Stairs to enter Entrance Stairs-Rails: None Entrance Stairs-Number of Steps: 1-2 Home Layout: One level Home Equipment: Walker - 2 wheels;Bedside commode      Prior Function Level of Independence: Independent               Hand Dominance        Extremity/Trunk Assessment   Upper  Extremity Assessment Upper Extremity Assessment: Overall WFL for tasks assessed    Lower Extremity Assessment Lower Extremity Assessment: LLE deficits/detail LLE Deficits / Details: LLE deficits consistent  with post op pain and weakness LLE Sensation: WNL    Cervical / Trunk Assessment Cervical / Trunk Assessment: Normal  Communication   Communication: No difficulties  Cognition Arousal/Alertness: Awake/alert Behavior During Therapy: WFL for tasks assessed/performed Overall Cognitive Status: Within Functional Limits for tasks assessed                                        General Comments General comments (skin integrity, edema, etc.): Patient mom in room during session    Exercises Total Joint Exercises Ankle Circles/Pumps: AROM;Both;20 reps;Seated Quad Sets: AROM;Left;10 reps;Seated Heel Slides: AROM;Left;10 reps;Seated   Assessment/Plan    PT Assessment Patient needs continued PT services  PT Problem List Decreased strength;Decreased range of motion;Decreased activity tolerance;Decreased balance;Decreased mobility;Decreased knowledge of use of DME;Decreased knowledge of precautions;Pain       PT Treatment Interventions DME instruction;Gait training;Stair training;Functional mobility training;Therapeutic activities;Therapeutic exercise;Balance training;Patient/family education    PT Goals (Current goals can be found in the Care Plan section)  Acute Rehab PT Goals Patient Stated Goal: go home PT Goal Formulation: With patient Time For Goal Achievement: 07/11/18 Potential to Achieve Goals: Good    Frequency 7X/week   Barriers to discharge        Co-evaluation               AM-PAC PT "6 Clicks" Mobility  Outcome Measure Help needed turning from your back to your side while in a flat bed without using bedrails?: None Help needed moving from lying on your back to sitting on the side of a flat bed without using bedrails?: None Help needed moving to and from a bed to a chair (including a wheelchair)?: A Little Help needed standing up from a chair using your arms (e.g., wheelchair or bedside chair)?: A Little Help needed to walk in hospital room?: A  Little Help needed climbing 3-5 steps with a railing? : A Lot 6 Click Score: 19    End of Session Equipment Utilized During Treatment: Gait belt Activity Tolerance: Patient tolerated treatment well Patient left: in chair;with call bell/phone within reach;with family/visitor present Nurse Communication: Mobility status PT Visit Diagnosis: Difficulty in walking, not elsewhere classified (R26.2);Muscle weakness (generalized) (M62.81);Pain Pain - Right/Left: Left Pain - part of body: Knee    Time: 9470-9628 PT Time Calculation (min) (ACUTE ONLY): 23 min   Charges:   PT Evaluation $PT Eval Low Complexity: 1 Low PT Treatments $Gait Training: 8-22 mins        Vanessa Ralphs, SPT  Vanessa Ralphs 06/27/2018, 6:12 PM

## 2018-06-27 NOTE — Social Work (Signed)
CSW acknowledging consult for SNF placement. Will follow for therapy recommendations.   Shawnta Schlegel, MSW, LCSWA [place] Clinical Social Work [phone]   

## 2018-06-27 NOTE — Anesthesia Procedure Notes (Signed)
Procedure Name: MAC Date/Time: 06/27/2018 9:00 AM Performed by: Kathryne Hitch, CRNA Pre-anesthesia Checklist: Patient identified, Emergency Drugs available, Suction available, Patient being monitored and Timeout performed Patient Re-evaluated:Patient Re-evaluated prior to induction Oxygen Delivery Method: Simple face mask Preoxygenation: Pre-oxygenation with 100% oxygen Induction Type: IV induction Dental Injury: Teeth and Oropharynx as per pre-operative assessment

## 2018-06-27 NOTE — Anesthesia Procedure Notes (Signed)
Anesthesia Regional Block: Adductor canal block   Pre-Anesthetic Checklist: ,, timeout performed, Correct Patient, Correct Site, Correct Laterality, Correct Procedure, Correct Position, site marked, Risks and benefits discussed,  Surgical consent,  Pre-op evaluation,  At surgeon's request and post-op pain management  Laterality: Left  Prep: chloraprep       Needles:  Injection technique: Single-shot  Needle Type: Stimiplex     Needle Length: 9cm  Needle Gauge: 21     Additional Needles:   Procedures:,,,, ultrasound used (permanent image in chart),,,,  Narrative:  Start time: 06/27/2018 8:05 AM End time: 06/27/2018 8:10 AM Injection made incrementally with aspirations every 5 mL.  Performed by: Personally  Anesthesiologist: Lowella Curb, MD

## 2018-06-27 NOTE — Transfer of Care (Signed)
Immediate Anesthesia Transfer of Care Note  Patient: Amy [name]  Procedure(s) Performed: LEFT KNEE PARTIAL REPLACEMENT (Left )  Patient Location: PACU  Anesthesia Type:Regional and Spinal  Level of Consciousness: awake, alert  and patient cooperative  Airway & Oxygen Therapy: Patient Spontanous Breathing  Post-op Assessment: Report given to RN and Post -op Vital signs reviewed and stable  Post vital signs: Reviewed and stable  Last Vitals:  Vitals Value Taken Time  BP 128/77 06/27/2018 11:30 AM  Temp    Pulse 78 06/27/2018 11:30 AM  Resp 25 06/27/2018 11:30 AM  SpO2 93 % 06/27/2018 11:30 AM  Vitals shown include unvalidated device data.  Last Pain:  Vitals:   06/27/18 0820  PainSc: 0-No pain         Complications: No apparent anesthesia complications

## 2018-06-27 NOTE — Anesthesia Postprocedure Evaluation (Signed)
Anesthesia Post Note  Patient: Amy [name]  Procedure(s) Performed: LEFT [name] PARTIAL REPLACEMENT (Left )     Patient location during evaluation: PACU Anesthesia Type: Spinal Level of consciousness: oriented and awake and alert Pain management: pain level controlled Vital Signs Assessment: post-procedure vital signs reviewed and stable Respiratory status: spontaneous breathing and respiratory function stable Cardiovascular status: blood pressure returned to baseline and stable Postop Assessment: no headache, no backache and no apparent nausea or vomiting Anesthetic complications: no    Last Vitals:  Vitals:   06/27/18 1200 06/27/18 1220  BP: 131/79 128/74  Pulse: 65 65  Resp: 20 18  Temp: (!) 36.4 C   SpO2: 94% 94%    Last Pain:  Vitals:   06/27/18 1200  PainSc: 0-No pain                 Lowella Curb

## 2018-06-28 ENCOUNTER — Telehealth (INDEPENDENT_AMBULATORY_CARE_PROVIDER_SITE_OTHER): Payer: Self-pay | Admitting: Orthopedic Surgery

## 2018-06-28 ENCOUNTER — Encounter (HOSPITAL_COMMUNITY): Payer: Self-pay | Admitting: Orthopedic Surgery

## 2018-06-28 DIAGNOSIS — M1712 Unilateral primary osteoarthritis, left knee: Secondary | ICD-10-CM | POA: Diagnosis not present

## 2018-06-28 MED ORDER — ACETAMINOPHEN 325 MG PO TABS: 325.0000 mg | ORAL_TABLET | Freq: Four times a day (QID) | ORAL | 0 refills | Status: DC | PRN

## 2018-06-28 MED ORDER — ASPIRIN 81 MG PO CHEW: 81.0000 mg | CHEWABLE_TABLET | Freq: Two times a day (BID) | ORAL | 0 refills | Status: AC

## 2018-06-28 MED ORDER — OXYCODONE HCL 5 MG PO TABS: 5.0000 mg | ORAL_TABLET | ORAL | 0 refills | Status: DC | PRN

## 2018-06-28 MED ORDER — METHOCARBAMOL 500 MG PO TABS: 500.0000 mg | ORAL_TABLET | Freq: Four times a day (QID) | ORAL | 0 refills | Status: AC | PRN

## 2018-06-28 NOTE — Plan of Care (Signed)
  Problem: Education: Goal: Knowledge of General Education information will improve Description Including pain rating scale, medication(s)/side effects and non-pharmacologic comfort measures Outcome: Progressing   Problem: Clinical Measurements: Goal: Ability to maintain clinical measurements within normal limits will improve Outcome: Progressing Goal: Diagnostic test results will improve Outcome: Progressing Goal: Respiratory complications will improve Outcome: Not Applicable Goal: Cardiovascular complication will be avoided Outcome: Progressing   Problem: Activity: Goal: Risk for activity intolerance will decrease Outcome: Progressing   Problem: Nutrition: Goal: Adequate nutrition will be maintained Outcome: Progressing

## 2018-06-28 NOTE — Progress Notes (Signed)
Physical Therapy Treatment Patient Details Name: Amy [name] MRN: [id number] DOB: 23-May-1965 Today's Date: 06/28/2018    History of Present Illness Patient is 53 y/o female s/p L partial knee replacement. PMH includes HTN and arthritis.     PT Comments    Patient seen for mobility progression. Pt is making progress toward PT goals and tolerated gait training distance of 150 ft. Pt is able to ascend/descend steps simulating home entrance. Pt requires min guard assist for functional transfers and gait and min A for bed mobility. Continue to progress as tolerated.    Follow Up Recommendations  Follow surgeon's recommendation for DC plan and follow-up therapies     Equipment Recommendations  None recommended by PT    Recommendations for Other Services       Precautions / Restrictions Precautions Precautions: Knee Precaution Comments: reviewed knee precautions with patient Restrictions Weight Bearing Restrictions: Yes LLE Weight Bearing: Weight bearing as tolerated    Mobility  Bed Mobility Overal bed mobility: Needs Assistance Bed Mobility: Supine to Sit;Sit to Supine     Supine to sit: Min assist Sit to supine: Min assist   General bed mobility comments: assist required to mobilize R LE   Transfers Overall transfer level: Needs assistance Equipment used: Rolling walker (2 wheeled) Transfers: Sit to/from Stand Sit to Stand: Min guard         General transfer comment: cues for safe hand placement  Ambulation/Gait Ambulation/Gait assistance: Min guard Gait Distance (Feet): 150 Feet Assistive device: Rolling walker (2 wheeled) Gait Pattern/deviations: Step-to pattern;Decreased step length - right;Decreased stance time - left;Decreased weight shift to left;Antalgic Gait velocity: decreased   General Gait Details: cues for sequencing, safe use of AD, and increased R step length and height; pt tends to scoot R foot on floor to avoid increased weight bearing on L  LE   Stairs Stairs: Yes Stairs assistance: Min assist Stair Management: No rails;Step to pattern;Backwards;With walker Number of Stairs: 2 General stair comments: cues for sequencing and technique; assist to stabilize RW; pt given stair handout   Wheelchair Mobility    Modified Rankin (Stroke Patients Only)       Balance Overall balance assessment: Needs assistance Sitting-balance support: No upper extremity supported;Feet supported Sitting balance-Leahy Scale: Good     Standing balance support: Bilateral upper extremity supported Standing balance-Leahy Scale: Poor                              Cognition Arousal/Alertness: Awake/alert Behavior During Therapy: WFL for tasks assessed/performed Overall Cognitive Status: Within Functional Limits for tasks assessed                                        Exercises      General Comments        Pertinent Vitals/Pain Pain Assessment: Faces Faces Pain Scale: Hurts even more Pain Location: L knee Pain Descriptors / Indicators: Guarding;Sore Pain Intervention(s): Limited activity within patient's tolerance;Monitored during session;Premedicated before session;Repositioned    Home Living                      Prior Function            PT Goals (current goals can now be found in the care plan section) Acute Rehab PT Goals Patient Stated Goal: go home Progress towards PT  goals: Progressing toward goals    Frequency    7X/week      PT Plan Current plan remains appropriate    Co-evaluation              AM-PAC PT "6 Clicks" Mobility   Outcome Measure  Help needed turning from your back to your side while in a flat bed without using bedrails?: None Help needed moving from lying on your back to sitting on the side of a flat bed without using bedrails?: None Help needed moving to and from a bed to a chair (including a wheelchair)?: A Little Help needed standing up from a  chair using your arms (e.g., wheelchair or bedside chair)?: A Little Help needed to walk in hospital room?: A Little Help needed climbing 3-5 steps with a railing? : A Little 6 Click Score: 20    End of Session Equipment Utilized During Treatment: Gait belt Activity Tolerance: Patient tolerated treatment well Patient left: with call bell/phone within reach;with family/visitor present;in bed Nurse Communication: Mobility status PT Visit Diagnosis: Difficulty in walking, not elsewhere classified (R26.2);Muscle weakness (generalized) (M62.81);Pain Pain - Right/Left: Left Pain - part of body: Knee     Time: 1017-1050 PT Time Calculation (min) (ACUTE ONLY): 33 min  Charges:  $Gait Training: 23-37 mins                     Erline Levine, PTA Acute Rehabilitation Services Pager: [phone] Office: [phone]     Carolynne Edouard 06/28/2018, 11:26 AM

## 2018-06-28 NOTE — Telephone Encounter (Signed)
[facility] LMVM advising HHPT will be initiated once patient is discharged from hospital

## 2018-06-28 NOTE — Care Management Note (Addendum)
Case Management Note  Patient Details  Name: Amy [name] MRN: [id number] Date of Birth: 12/29/1965  Subjective/Objective:   53 yr old female s/p left unicompartmental knee replacement.            Action/Plan: Case manager spoke with patient and her mon concerning discharge plan and DME. Patient's mom has requested HHPT. Referral was called to Dara Hoyer, [facility]. DME has been delivered tpo patient's home and her mom will be assisting  her at discharge.    Expected Discharge Date:  06/28/18               Expected Discharge Plan:  Home w Home Health Services  In-House Referral:  NA  Discharge planning Services  CM Consult  Post Acute Care Choice:  Home Health, Durable Medical Equipment Choice offered to:  Patient, Parent  DME Arranged:  N/A(RW, 3in1 and CPM have been delivered to patient's home) DME Agency:  [employer]  HH Arranged:  PT HH Agency:  [facility] Status of Service:  Completed, signed off  If discussed at Long Length of Stay Meetings, dates discussed:    Additional Comments:Case manager received call from Dara Hoyer at 12:55 p stating [facility] can not accept patient. CM called referral to Rhunette Croft with [facility], they will be able to accept patient. CM informed patient and her mom.  Durenda Guthrie, RN 06/28/2018, 12:48 PM

## 2018-06-28 NOTE — Progress Notes (Signed)
Subjective: Patient stable.  Pain controlled   Objective: Vital signs in last 24 hours: Temp:  [97 F (36.1 C)-98.4 F (36.9 C)] 98 F (36.7 C) (02/28 0524) Pulse Rate:  [65-91] 76 (02/28 0524) Resp:  [16-22] 20 (02/28 0524) BP: (110-186)/(51-96) 152/78 (02/28 0524) SpO2:  [93 %-100 %] 98 % (02/28 0524)  Intake/Output from previous day: 02/27 0701 - 02/28 0700 In: 2290 [P.O.:990; I.V.:1300] Out: 1103 [Urine:1003; Blood:100] Intake/Output this shift: No intake/output data recorded.  Exam:  Dorsiflexion/Plantar flexion intact  Labs: No results for input(s): HGB in the last 72 hours. No results for input(s): WBC, RBC, HCT, PLT in the last 72 hours. No results for input(s): NA, K, CL, CO2, BUN, CREATININE, GLUCOSE, CALCIUM in the last 72 hours. No results for input(s): LABPT, INR in the last 72 hours.  Assessment/Plan: Plan at this time is to discharge to home after physical therapy today.  Need to have Ace wrap removed.  Prescription sent into pharmacy.   Marrianne Mood Dean 06/28/2018, 7:43 AM

## 2018-06-28 NOTE — Progress Notes (Signed)
Dr August Saucer was notified of the patient's family requesting PT at home.  Dr August Saucer will place an order for Home Health Physical Therapy.

## 2018-06-28 NOTE — Telephone Encounter (Signed)
Patient mother Amy [name] called and wanted to know if [facility] PT was coming out this week or next week.   Please call mother  to verify with patient.  [phone]  Ok to leave vm if no answer.

## 2018-06-29 DIAGNOSIS — M1712 Unilateral primary osteoarthritis, left knee: Secondary | ICD-10-CM

## 2018-07-01 NOTE — Discharge Summary (Signed)
Physician Discharge Summary  Patient ID: Amy [name] MRN: [id number] DOB/AGE: [birth year] 54 y.o.  Admit date: 06/27/2018 Discharge date: 06/28/2018  Admission Diagnoses:  Active Problems:   Arthritis of knee   Unilateral primary osteoarthritis, left knee   Discharge Diagnoses:  Same  Surgeries: Procedure(s): LEFT KNEE PARTIAL REPLACEMENT on 06/27/2018   Consultants:   Discharged Condition: Stable  Hospital Course: Amy [name] is an 53 y.o. female who was admitted 06/27/2018 with a chief complaint of left knee pain, and found to have a diagnosis of left knee arthritis.  They were brought to the operating room on 06/27/2018 and underwent the above named procedures.  Patient tolerated the procedure well without immediate complication.  She was started on CPM machine and physical therapy.  This was done to improve her functionality and range of motion.  Patient was discharged home on postop day #1 in good condition.  She will follow-up with me in about 10 days.  Weightbearing as tolerated indicated.  Pain medicine prescribed along with aspirin for DVT prophylaxis  Antibiotics given:  Anti-infectives (From admission, onward)   Start     Dose/Rate Route Frequency Ordered Stop   06/27/18 1500  ceFAZolin (ANCEF) IVPB 2g/100 mL premix     2 g 200 mL/hr over 30 Minutes Intravenous Every 6 hours 06/27/18 1212 06/27/18 2128   06/27/18 0715  ceFAZolin (ANCEF) IVPB 2g/100 mL premix     2 g 200 mL/hr over 30 Minutes Intravenous On call to O.R. 06/27/18 4854 06/27/18 6270    .  Recent vital signs:  Vitals:   06/28/18 0852 06/28/18 1302  BP: (!) 152/81 132/81  Pulse: 82 90  Resp: 17 16  Temp: 98.6 F (37 C) 99.5 F (37.5 C)  SpO2: 96% 100%    Recent laboratory studies:  Results for orders placed or performed during the hospital encounter of 06/21/18  Surgical pcr screen  Result Value Ref Range   MRSA, PCR NEGATIVE NEGATIVE   Staphylococcus aureus NEGATIVE NEGATIVE  Urine culture   Result Value Ref Range   Specimen Description URINE, CLEAN CATCH    Special Requests      NONE Performed at [facility], [address], [place], [place] [address]    Culture MULTIPLE SPECIES PRESENT, SUGGEST RECOLLECTION (A)    Report Status 06/22/2018 FINAL   Basic metabolic panel  Result Value Ref Range   Sodium 139 135 - 145 mmol/L   Potassium 3.9 3.5 - 5.1 mmol/L   Chloride 108 98 - 111 mmol/L   CO2 22 22 - 32 mmol/L   Glucose, Bld 92 70 - 99 mg/dL   BUN 12 6 - 20 mg/dL   Creatinine, Ser 3.81 0.44 - 1.00 mg/dL   Calcium 9.3 8.9 - 82.9 mg/dL   GFR calc non Af Amer >60 >60 mL/min   GFR calc Af Amer >60 >60 mL/min   Anion gap 9 5 - 15  CBC  Result Value Ref Range   WBC 6.0 4.0 - 10.5 K/uL   RBC 4.00 3.87 - 5.11 MIL/uL   Hemoglobin 12.9 12.0 - 15.0 g/dL   HCT 93.7 16.9 - 67.8 %   MCV 99.5 80.0 - 100.0 fL   MCH 32.3 26.0 - 34.0 pg   MCHC 32.4 30.0 - 36.0 g/dL   RDW 93.8 10.1 - 75.1 %   Platelets 305 150 - 400 K/uL   nRBC 0.0 0.0 - 0.2 %  Urinalysis, Routine w reflex microscopic  Result Value Ref Range  Color, Urine YELLOW YELLOW   APPearance HAZY (A) CLEAR   Specific Gravity, Urine 1.015 1.005 - 1.030   pH 7.0 5.0 - 8.0   Glucose, UA NEGATIVE NEGATIVE mg/dL   Hgb urine dipstick NEGATIVE NEGATIVE   Bilirubin Urine NEGATIVE NEGATIVE   Ketones, ur NEGATIVE NEGATIVE mg/dL   Protein, ur NEGATIVE NEGATIVE mg/dL   Nitrite NEGATIVE NEGATIVE   Leukocytes,Ua LARGE (A) NEGATIVE   RBC / HPF 0-5 0 - 5 RBC/hpf   WBC, UA 0-5 0 - 5 WBC/hpf   Bacteria, UA RARE (A) NONE SEEN   Squamous Epithelial / LPF 6-10 0 - 5   Mucus PRESENT     Discharge Medications:   Allergies as of 06/28/2018   No Known Allergies     Medication List    TAKE these medications   acetaminophen 325 MG tablet Commonly known as:  TYLENOL Take 1-2 tablets (325-650 mg total) by mouth every 6 (six) hours as needed for mild pain (pain score 1-3 or temp > 100.5).   amLODipine 5 MG tablet Commonly  known as:  NORVASC Take 5 mg by mouth daily.   aspirin 81 MG chewable tablet Chew 1 tablet (81 mg total) by mouth 2 (two) times daily.   diclofenac 75 MG EC tablet Commonly known as:  VOLTAREN Take 75 mg by mouth daily as needed for moderate pain.   docusate sodium 100 MG capsule Commonly known as:  COLACE Take 1 capsule (100 mg total) by mouth every 12 (twelve) hours. What changed:    when to take this  reasons to take this   loratadine 10 MG tablet Commonly known as:  CLARITIN Take 10 mg by mouth daily as needed for allergies.   losartan 100 MG tablet Commonly known as:  COZAAR Take 100 mg by mouth daily.   methocarbamol 500 MG tablet Commonly known as:  ROBAXIN Take 1 tablet (500 mg total) by mouth every 6 (six) hours as needed for muscle spasms.   oxyCODONE 5 MG immediate release tablet Commonly known as:  Oxy IR/ROXICODONE Take 1-2 tablets (5-10 mg total) by mouth every 4 (four) hours as needed for moderate pain (pain score 4-6).   polyethylene glycol powder powder Commonly known as:  GLYCOLAX/MIRALAX Take 1 capful dissolved in 4-8 ounces of water/juice daily What changed:    how much to take  how to take this  when to take this  additional instructions   traMADol 50 MG tablet Commonly known as:  ULTRAM Take 1 tablet (50 mg total) by mouth 3 (three) times daily. What changed:    when to take this  reasons to take this   VITAMIN C PO Take 1 tablet by mouth daily as needed (immune support).       Diagnostic Studies: No results found.  Disposition:   Discharge Instructions    Call MD / Call 911   Complete by:  As directed    If you experience chest pain or shortness of breath, CALL 911 and be transported to the hospital emergency room.  If you develope a fever above 101 F, pus (white drainage) or increased drainage or redness at the wound, or calf pain, call your surgeon's office.   Constipation Prevention   Complete by:  As directed    Drink  plenty of fluids.  Prune juice may be helpful.  You may use a stool softener, such as Colace (over the counter) 100 mg twice a day.  Use MiraLax (over the counter) for  constipation as needed.   Diet - low sodium heart healthy   Complete by:  As directed    Discharge instructions   Complete by:  As directed    Weightbearing as tolerated with walker CPM machine 1 hour 3 times a day.  Increase the degrees daily as needed and tolerated. Okay to shower, dressing waterproof   Increase activity slowly as tolerated   Complete by:  As directed       Follow-up Information    [facility] Follow up.   Specialty:  Home Health Services Why:  A representative from [facility] will contact you to arrange start date and time for your therapy. Contact information: [address] [place] [address] [phone]            Signed: Burnard Bunting 07/01/2018, 5:06 PM

## 2018-07-02 ENCOUNTER — Telehealth (INDEPENDENT_AMBULATORY_CARE_PROVIDER_SITE_OTHER): Payer: Self-pay

## 2018-07-02 NOTE — Telephone Encounter (Signed)
IC s/w patients mother. She stated that she did have HHPT set up and that [facility] from [place] set them up with HHPT. They came out yesterday and will return tomorrow. I advised that if there were any questions to call us back.

## 2018-07-02 NOTE — Telephone Encounter (Signed)
Pt's mother states that another agency has admitted pt for services for physical therapy and so [facility] is not able to go out and pick up pt for service. FYI they did not know the name of the other company

## 2018-07-11 ENCOUNTER — Telehealth (INDEPENDENT_AMBULATORY_CARE_PROVIDER_SITE_OTHER): Payer: Self-pay | Admitting: Orthopedic Surgery

## 2018-07-11 ENCOUNTER — Other Ambulatory Visit (INDEPENDENT_AMBULATORY_CARE_PROVIDER_SITE_OTHER): Payer: Self-pay | Admitting: Physician Assistant

## 2018-07-11 MED ORDER — OXYCODONE HCL 5 MG PO TABS: 5.0000 mg | ORAL_TABLET | ORAL | 0 refills | Status: DC | PRN

## 2018-07-11 NOTE — Telephone Encounter (Signed)
Can you advise? 

## 2018-07-11 NOTE — Telephone Encounter (Signed)
Done

## 2018-07-11 NOTE — Telephone Encounter (Signed)
Patient's mother called to request a RX refill on the patient's Oxycodone.  CB#[phone].  Patient uses [facility] in [place].  Thank you.

## 2018-07-12 ENCOUNTER — Inpatient Hospital Stay (INDEPENDENT_AMBULATORY_CARE_PROVIDER_SITE_OTHER): Payer: BLUE CROSS/BLUE SHIELD | Admitting: Orthopedic Surgery

## 2018-07-15 ENCOUNTER — Ambulatory Visit (INDEPENDENT_AMBULATORY_CARE_PROVIDER_SITE_OTHER): Payer: BLUE CROSS/BLUE SHIELD | Admitting: Orthopedic Surgery

## 2018-07-15 ENCOUNTER — Encounter (INDEPENDENT_AMBULATORY_CARE_PROVIDER_SITE_OTHER): Payer: Self-pay | Admitting: Orthopedic Surgery

## 2018-07-15 ENCOUNTER — Other Ambulatory Visit: Payer: Self-pay

## 2018-07-15 ENCOUNTER — Ambulatory Visit (INDEPENDENT_AMBULATORY_CARE_PROVIDER_SITE_OTHER): Payer: BLUE CROSS/BLUE SHIELD

## 2018-07-15 DIAGNOSIS — M1712 Unilateral primary osteoarthritis, left knee: Secondary | ICD-10-CM

## 2018-07-15 DIAGNOSIS — Z96652 Presence of left artificial knee joint: Secondary | ICD-10-CM

## 2018-07-17 ENCOUNTER — Encounter (INDEPENDENT_AMBULATORY_CARE_PROVIDER_SITE_OTHER): Payer: Self-pay | Admitting: Orthopedic Surgery

## 2018-07-17 NOTE — Progress Notes (Signed)
   Post-Op Visit Note   Patient: Amy [name]           Date of Birth: 1965/10/03           MRN: [id number] Visit Date: 07/15/2018 PCP: Bosie Clos, MD   Assessment & Plan:  Chief Complaint:  Chief Complaint  Patient presents with  . Left Knee - Routine Post Op   Visit Diagnoses:  1. S/P total knee arthroplasty, left     Plan: [name] is a patient who is now 2 weeks out left unicompartmental knee replacement.  She is on 90 degrees on her CPM.  Incision intact.  Radiographs look good.  Refill oxycodone and come back in 3 weeks.  Start physical therapy as well.  Follow-Up Instructions: Return in about 3 weeks (around 08/05/2018).   Orders:  Orders Placed This Encounter  Procedures  . XR Knee 1-2 Views Left   No orders of the defined types were placed in this encounter.   Imaging: No results found.  PMFS History: Patient Active Problem List   Diagnosis Date Noted  . Unilateral primary osteoarthritis, left knee   . Arthritis of knee 06/27/2018   Past Medical History:  Diagnosis Date  . Arthritis   . Hypertension     History reviewed. No pertinent family history.  Past Surgical History:  Procedure Laterality Date  . ABDOMINAL HYSTERECTOMY    . KNEE ARTHROPLASTY Left 06/27/2018   PARTIAL REPLACEMENT  . MEDIAL PARTIAL KNEE REPLACEMENT Left 06/27/2018   Procedure: LEFT KNEE PARTIAL REPLACEMENT;  Surgeon: Cammy Copa, MD;  Location: [facility] OR;  Service: Orthopedics;  Laterality: Left;  . WISDOM TOOTH EXTRACTION     Social History   Occupational History  . Not on file  Tobacco Use  . Smoking status: Never Smoker  . Smokeless tobacco: Never Used  Substance and Sexual Activity  . Alcohol use: No  . Drug use: No  . Sexual activity: Yes    Birth control/protection: Surgical

## 2018-07-29 ENCOUNTER — Telehealth (INDEPENDENT_AMBULATORY_CARE_PROVIDER_SITE_OTHER): Payer: Self-pay | Admitting: Orthopedic Surgery

## 2018-07-29 MED ORDER — OXYCODONE HCL 5 MG PO TABS: ORAL_TABLET | ORAL | 0 refills | Status: DC

## 2018-07-29 NOTE — Telephone Encounter (Signed)
Ok for rf of prior med pls clal thx

## 2018-07-29 NOTE — Telephone Encounter (Signed)
Please advise. Thanks.  

## 2018-07-29 NOTE — Telephone Encounter (Signed)
IC advised could pick up at front desk.  

## 2018-07-29 NOTE — Telephone Encounter (Signed)
Patient called left voicemail message on voicemail needing Rx for pain sent to [facility] in [place]   The number to contact patient is [phone]

## 2018-08-07 ENCOUNTER — Ambulatory Visit (INDEPENDENT_AMBULATORY_CARE_PROVIDER_SITE_OTHER): Payer: BLUE CROSS/BLUE SHIELD | Admitting: Orthopedic Surgery

## 2018-08-14 ENCOUNTER — Ambulatory Visit (INDEPENDENT_AMBULATORY_CARE_PROVIDER_SITE_OTHER): Payer: BLUE CROSS/BLUE SHIELD | Admitting: Orthopedic Surgery

## 2018-08-14 ENCOUNTER — Other Ambulatory Visit: Payer: Self-pay

## 2018-08-14 ENCOUNTER — Encounter (INDEPENDENT_AMBULATORY_CARE_PROVIDER_SITE_OTHER): Payer: Self-pay | Admitting: Orthopedic Surgery

## 2018-08-14 DIAGNOSIS — Z96652 Presence of left artificial knee joint: Secondary | ICD-10-CM

## 2018-08-14 NOTE — Progress Notes (Signed)
   Post-Op Visit Note   Patient: Amy [name]           Date of Birth: August 17, 1965           MRN: [id number] Visit Date: 08/14/2018 PCP: Bosie Clos, MD   Assessment & Plan:  Chief Complaint:  Chief Complaint  Patient presents with  . Left Knee - Follow-up   Visit Diagnoses:  1. S/P total knee arthroplasty, left     Plan: [name] is a patient who is now about 6 weeks out left partial knee replacement.  She is having good and bad days.  On exam she still lacks about 70 degrees of full extension.  Mild effusion is present.  I am going to have her continue with physical therapy.  Would like to get a heel lift for the right shoe so that we can help her to get functional extension in that left leg so she is not limping.  I will see her back in about 5 weeks for clinical recheck.  Follow-Up Instructions: Return in about 5 weeks (around 09/18/2018).   Orders:  No orders of the defined types were placed in this encounter.  No orders of the defined types were placed in this encounter.   Imaging: No results found.  PMFS History: Patient Active Problem List   Diagnosis Date Noted  . Unilateral primary osteoarthritis, left knee   . Arthritis of knee 06/27/2018   Past Medical History:  Diagnosis Date  . Arthritis   . Hypertension     History reviewed. No pertinent family history.  Past Surgical History:  Procedure Laterality Date  . ABDOMINAL HYSTERECTOMY    . KNEE ARTHROPLASTY Left 06/27/2018   PARTIAL REPLACEMENT  . MEDIAL PARTIAL KNEE REPLACEMENT Left 06/27/2018   Procedure: LEFT KNEE PARTIAL REPLACEMENT;  Surgeon: Cammy Copa, MD;  Location: [facility] OR;  Service: Orthopedics;  Laterality: Left;  . WISDOM TOOTH EXTRACTION     Social History   Occupational History  . Not on file  Tobacco Use  . Smoking status: Never Smoker  . Smokeless tobacco: Never Used  Substance and Sexual Activity  . Alcohol use: No  . Drug use: No  . Sexual activity: Yes    Birth  control/protection: Surgical

## 2018-09-11 ENCOUNTER — Other Ambulatory Visit: Payer: Self-pay

## 2018-09-11 ENCOUNTER — Encounter: Payer: Self-pay | Admitting: Orthopedic Surgery

## 2018-09-11 ENCOUNTER — Ambulatory Visit (INDEPENDENT_AMBULATORY_CARE_PROVIDER_SITE_OTHER): Payer: BLUE CROSS/BLUE SHIELD | Admitting: Orthopedic Surgery

## 2018-09-11 DIAGNOSIS — Z96652 Presence of left artificial knee joint: Secondary | ICD-10-CM

## 2018-09-11 MED ORDER — HYDROCODONE-ACETAMINOPHEN 5-325 MG PO TABS: ORAL_TABLET | ORAL | 0 refills | Status: DC

## 2018-09-11 MED ORDER — METHOCARBAMOL 500 MG PO TABS: 500.0000 mg | ORAL_TABLET | Freq: Two times a day (BID) | ORAL | 0 refills | Status: AC | PRN

## 2018-09-11 NOTE — Addendum Note (Signed)
Addended byPrescott Parma on: 09/11/2018 10:58 AM   Modules accepted: Orders

## 2018-09-11 NOTE — Progress Notes (Signed)
   Post-Op Visit Note   Patient: Amy [name]           Date of Birth: November 29, 1965           MRN: [id number] Visit Date: 09/11/2018 PCP: Bosie Clos, MD   Assessment & Plan:  Chief Complaint:  Chief Complaint  Patient presents with  . Left Knee - Pain   Visit Diagnoses:  1. S/P total knee arthroplasty, left     Plan: [name] is now about 10 weeks out left total knee replacement.  She has good bad days.  Going to therapy twice a week.  Wants to try to return to work in about 6 weeks.  She is using a heel lift on that right-hand side.  On exam she has improved her extension.  She is only lacking about 2 or 3 degrees at this time.  Her right leg hyperextends.  She still walks with somewhat of a bent knee gait.  Plan at this time is out of work 6 more weeks.  Refill Norco and Robaxin.  Come back in 6 weeks for clinical recheck.  She should continue to work on leg strengthening to improve her standing endurance.  Follow-Up Instructions: Return in about 6 weeks (around 10/23/2018).   Orders:  No orders of the defined types were placed in this encounter.  No orders of the defined types were placed in this encounter.   Imaging: No results found.  PMFS History: Patient Active Problem List   Diagnosis Date Noted  . Unilateral primary osteoarthritis, left knee   . Arthritis of knee 06/27/2018   Past Medical History:  Diagnosis Date  . Arthritis   . Hypertension     History reviewed. No pertinent family history.  Past Surgical History:  Procedure Laterality Date  . ABDOMINAL HYSTERECTOMY    . KNEE ARTHROPLASTY Left 06/27/2018   PARTIAL REPLACEMENT  . MEDIAL PARTIAL KNEE REPLACEMENT Left 06/27/2018   Procedure: LEFT KNEE PARTIAL REPLACEMENT;  Surgeon: Cammy Copa, MD;  Location: [facility] OR;  Service: Orthopedics;  Laterality: Left;  . WISDOM TOOTH EXTRACTION     Social History   Occupational History  . Not on file  Tobacco Use  . Smoking status: Never Smoker  .  Smokeless tobacco: Never Used  Substance and Sexual Activity  . Alcohol use: No  . Drug use: No  . Sexual activity: Yes    Birth control/protection: Surgical

## 2018-09-19 ENCOUNTER — Telehealth: Payer: Self-pay | Admitting: Orthopedic Surgery

## 2018-09-19 NOTE — Telephone Encounter (Signed)
Patient mom called in wanting to know if she should make another appt for Dr. August Saucer to discharge her and fill out paper work to go back to work. Patient is to return to work by October 21, 2018 but job needs paper work in their hand by October 03, 2018. Patients mother would like it if someone would call her back and let her know what she needs to do

## 2018-09-19 NOTE — Telephone Encounter (Signed)
Please advise. Do you need patient to have a final visit to release her back to work?

## 2018-09-20 NOTE — Telephone Encounter (Signed)
I would have her come in for her scheduled appointment but I cannot release her to go back to work on June 22  that's not a problem if she just brings the paperwork in.

## 2018-09-20 NOTE — Telephone Encounter (Signed)
Appointment made for patient.

## 2018-10-16 ENCOUNTER — Other Ambulatory Visit: Payer: Self-pay

## 2018-10-16 ENCOUNTER — Ambulatory Visit (INDEPENDENT_AMBULATORY_CARE_PROVIDER_SITE_OTHER): Payer: BC Managed Care – PPO | Admitting: Orthopedic Surgery

## 2018-10-16 ENCOUNTER — Encounter: Payer: Self-pay | Admitting: Orthopedic Surgery

## 2018-10-16 DIAGNOSIS — Z96652 Presence of left artificial knee joint: Secondary | ICD-10-CM

## 2018-10-17 ENCOUNTER — Encounter: Payer: Self-pay | Admitting: Orthopedic Surgery

## 2018-10-17 NOTE — Progress Notes (Signed)
   Post-Op Visit Note   Patient: Amy [name]           Date of Birth: 05/27/1965           MRN: [id number] Visit Date: 10/16/2018 PCP: Garlan Fillers, MD   Assessment & Plan:  Chief Complaint:  Chief Complaint  Patient presents with  . Left Knee - Pain   Visit Diagnoses:  1. S/P total knee arthroplasty, left     Plan: Amy [name] is now about 3 and half months out left unicompartmental knee replacement.  She works as a Scientist, water quality at [employer].  She is able to do well with the stairs.  She is going to return to work next week.  In general clinically she is doing much better than she was last clinic visit.  On exam she has excellent range of motion trace effusion normal gait and improving quad strength.  Still about 2 cm of atrophy.  Plan at this time is letting her return to work 10/23/2018 with no restrictions.  Follow-up with me as needed.  Need for prophylaxis before dental procedures discussed.  Follow-Up Instructions: Return if symptoms worsen or fail to improve.   Orders:  No orders of the defined types were placed in this encounter.  No orders of the defined types were placed in this encounter.   Imaging: No results found.  PMFS History: Patient Active Problem List   Diagnosis Date Noted  . Unilateral primary osteoarthritis, left knee   . Arthritis of knee 06/27/2018   Past Medical History:  Diagnosis Date  . Arthritis   . Hypertension     History reviewed. No pertinent family history.  Past Surgical History:  Procedure Laterality Date  . ABDOMINAL HYSTERECTOMY    . KNEE ARTHROPLASTY Left 06/27/2018   PARTIAL REPLACEMENT  . MEDIAL PARTIAL KNEE REPLACEMENT Left 06/27/2018   Procedure: LEFT KNEE PARTIAL REPLACEMENT;  Surgeon: Meredith Pel, MD;  Location: [facility];  Service: Orthopedics;  Laterality: Left;  . WISDOM TOOTH EXTRACTION     Social History   Occupational History  . Not on file  Tobacco Use  . Smoking status: Never Smoker  . Smokeless tobacco:  Never Used  Substance and Sexual Activity  . Alcohol use: No  . Drug use: No  . Sexual activity: Yes    Birth control/protection: Surgical

## 2019-06-27 DIAGNOSIS — J302 Other seasonal allergic rhinitis: Secondary | ICD-10-CM | POA: Insufficient documentation

## 2019-06-27 DIAGNOSIS — Z6831 Body mass index (BMI) 31.0-31.9, adult: Secondary | ICD-10-CM | POA: Insufficient documentation

## 2019-07-17 ENCOUNTER — Ambulatory Visit: Payer: BC Managed Care – PPO

## 2020-12-03 ENCOUNTER — Ambulatory Visit: Payer: Self-pay

## 2020-12-03 ENCOUNTER — Encounter: Payer: Self-pay | Admitting: Orthopedic Surgery

## 2020-12-03 ENCOUNTER — Other Ambulatory Visit: Payer: Self-pay

## 2020-12-03 ENCOUNTER — Ambulatory Visit (INDEPENDENT_AMBULATORY_CARE_PROVIDER_SITE_OTHER): Payer: BC Managed Care – PPO | Admitting: Orthopedic Surgery

## 2020-12-03 DIAGNOSIS — M1711 Unilateral primary osteoarthritis, right knee: Secondary | ICD-10-CM

## 2020-12-03 DIAGNOSIS — M79604 Pain in right leg: Secondary | ICD-10-CM

## 2020-12-03 MED ORDER — CELECOXIB 100 MG PO CAPS: 100.0000 mg | ORAL_CAPSULE | Freq: Two times a day (BID) | ORAL | 2 refills | Status: AC

## 2020-12-03 NOTE — Progress Notes (Signed)
Office Visit Note   Patient: Amy [name]           Date of Birth: 05/28/1965           MRN: [id number] Visit Date: 12/03/2020 Requested by: Bosie Clos, MD [address] [place],  [place] [address] PCP: Bosie Clos, MD  Subjective: Chief Complaint  Patient presents with   Right Knee - Pain    HPI: Amy [name] is a 55 y.o. female who presents to the office complaining of right knee pain.  Patient states that she has had knee pain for about 2 months.  No known injury but localizes most of her pain to the anterior lateral aspect of the knee with radiation down the shin to the right ankle.  She has no radicular pain from her back or buttocks.  Denies any groin pain or buttocks pain.  Describes the pain as a throbbing pain that is worse with standing for long periods of time.  This is particularly difficult given her occupation as a Conservation officer, nature at [employer] and she is not allowed to use a chair.  She has been taking Tylenol, ibuprofen, Aleve without any lasting relief.  Denies any numbness or tingling in the leg.  She has a history of prior left unicompartmental knee replacement in February 2020.  She has had no prior right knee surgery.  She is doing very well with her left knee with no significant complaints aside from occasional soreness with inclement weather..                ROS: All systems reviewed are negative as they relate to the chief complaint within the history of present illness.  Patient denies fevers or chills.  Assessment & Plan: Visit Diagnoses:  1. Unilateral primary osteoarthritis, right knee   2. Pain in right leg     Plan: Patient is a 55 year old female who presents complaining of right knee pain.  She has anterior lateral knee pain that radiates down her shin that is worse with activity.  Pain is reproduced with palpation to the medial joint line.  She has history of left unicompartmental knee replacement that is doing well and she states that the current pain  she is having in her right knee feels like the left knee did prior to surgery.  No history of right knee surgery.  Discussed options available to patient.  She would like to try Celebrex as well as right knee injection.  Tolerated the injection well.  Follow-up with the office as needed.  We will preapproved her for gel injections in the meantime.  This patient is diagnosed with osteoarthritis of the knee(s).    Radiographs show evidence of joint space narrowing, osteophytes, subchondral sclerosis and/or subchondral cysts.  This patient has knee pain which interferes with functional and activities of daily living.    This patient has experienced inadequate response, adverse effects and/or intolerance with conservative treatments such as acetaminophen, NSAIDS, topical creams, physical therapy or regular exercise, knee bracing and/or weight loss.   This patient has experienced inadequate response or has a contraindication to intra articular steroid injections for at least 3 months.   This patient is not scheduled to have a total knee replacement within 6 months of starting treatment with viscosupplementation.   Follow-Up Instructions: No follow-ups on file.   Orders:  Orders Placed This Encounter  Procedures   XR Knee 1-2 Views Right   Meds ordered this encounter  Medications   celecoxib (CELEBREX)  100 MG capsule    Sig: Take 1 capsule (100 mg total) by mouth 2 (two) times daily.    Dispense:  60 capsule    Refill:  2      Procedures: Large Joint Inj: R knee on 12/03/2020 10:53 PM Indications: diagnostic evaluation, joint swelling and pain Details: 18 G 1.5 in needle, superolateral approach  Arthrogram: No  Medications: 5 mL lidocaine 1 %; 40 mg methylPREDNISolone acetate 40 MG/ML; 4 mL bupivacaine 0.25 % Aspirate: 10 mL Outcome: tolerated well, no immediate complications Procedure, treatment alternatives, risks and benefits explained, specific risks discussed. Consent was given by  the patient. Immediately prior to procedure a time out was called to verify the correct patient, procedure, equipment, support staff and site/side marked as required. Patient was prepped and draped in the usual sterile fashion.      Clinical Data: No additional findings.  Objective: Vital Signs: There were no vitals taken for this visit.  Physical Exam:  Constitutional: Patient appears well-developed HEENT:  Head: Normocephalic Eyes:EOM are normal Neck: Normal range of motion Cardiovascular: Normal rate Pulmonary/chest: Effort normal Neurologic: Patient is alert Skin: Skin is warm Psychiatric: Patient has normal mood and affect  Ortho Exam: Ortho exam demonstrates right knee with 0 degrees extension and 120 degrees of knee flexion.  No calf tenderness.  Negative Homans' sign.  Able to perform straight leg raise with excellent quadricep strength.  Tenderness over the medial joint line without tenderness over the lateral joint line.  No significant instability to varus/valgus stress or anterior/posterior drawer.  No pain with hip range of motion.  Specialty Comments:  No specialty comments available.  Imaging: No results found.   PMFS History: Patient Active Problem List   Diagnosis Date Noted   Unilateral primary osteoarthritis, left knee    Arthritis of knee 06/27/2018   Past Medical History:  Diagnosis Date   Arthritis    Hypertension     No family history on file.  Past Surgical History:  Procedure Laterality Date   ABDOMINAL HYSTERECTOMY     KNEE ARTHROPLASTY Left 06/27/2018   PARTIAL REPLACEMENT   MEDIAL PARTIAL KNEE REPLACEMENT Left 06/27/2018   Procedure: LEFT KNEE PARTIAL REPLACEMENT;  Surgeon: Cammy Copa, MD;  Location: [facility] OR;  Service: Orthopedics;  Laterality: Left;   WISDOM TOOTH EXTRACTION     Social History   Occupational History   Not on file  Tobacco Use   Smoking status: Never   Smokeless tobacco: Never  Vaping Use   Vaping Use:  Never used  Substance and Sexual Activity   Alcohol use: No   Drug use: No   Sexual activity: Yes    Birth control/protection: Surgical

## 2020-12-05 MED ORDER — METHYLPREDNISOLONE ACETATE 40 MG/ML IJ SUSP
40.0000 mg | INTRAMUSCULAR | Status: AC | PRN
  Administered 2020-12-03: 40 mg via INTRA_ARTICULAR

## 2020-12-05 MED ORDER — BUPIVACAINE HCL 0.25 % IJ SOLN
4.0000 mL | INTRAMUSCULAR | Status: AC | PRN
  Administered 2020-12-03: 4 mL via INTRA_ARTICULAR

## 2020-12-05 MED ORDER — LIDOCAINE HCL 1 % IJ SOLN
5.0000 mL | INTRAMUSCULAR | Status: AC | PRN
  Administered 2020-12-03: 5 mL

## 2020-12-08 ENCOUNTER — Telehealth: Payer: Self-pay | Admitting: Orthopedic Surgery

## 2020-12-08 NOTE — Telephone Encounter (Signed)
Pt states celebrex wasn't strrong enough. Can you call something in?

## 2020-12-09 ENCOUNTER — Telehealth: Payer: Self-pay | Admitting: Orthopedic Surgery

## 2020-12-09 NOTE — Telephone Encounter (Signed)
Pt called about an update on stronger medication. Please see first note. Please send to pharmacy on file. Pt is requesting to call and leave a message on her vm when prescription has been called in. Pt phone number is [phone].

## 2020-12-10 ENCOUNTER — Other Ambulatory Visit: Payer: Self-pay | Admitting: Surgical

## 2020-12-10 MED ORDER — TRAMADOL HCL 50 MG PO TABS: 50.0000 mg | ORAL_TABLET | Freq: Two times a day (BID) | ORAL | 0 refills | Status: DC | PRN

## 2020-12-10 NOTE — Telephone Encounter (Signed)
Addressed in msg to [facility]

## 2020-12-10 NOTE — Telephone Encounter (Signed)
Celebrex was sent into pharmacy however patient stated 1-2 days ago that this medication has not been helping with her pain. She is currently being treated for pain in her right knee. She has received recent injection on 12/03/2020 and wanting for gel injection approval.

## 2020-12-10 NOTE — Telephone Encounter (Signed)
Pt asking about an update on the medication prescription she requested Wednesday and Thursday. Pt would like the prescription before the weekend as she is in a lot of pain. The best pharmacy is the one on file and the best call back number is [phone].

## 2020-12-10 NOTE — Telephone Encounter (Signed)
If the injection and Celebrex hasn't really been helping, then we just have to try the gel injection when it's approved.  I can send in Tramadol to bridge the gap until the gel but don't want to refill the tramadol

## 2020-12-10 NOTE — Telephone Encounter (Signed)
Attempted to contact patient to make her aware of tramadol being sent in however this is not a medication that can be refilled however had to leave a voicemail. If patient has any additional questions or concerns office number was provided.

## 2020-12-13 ENCOUNTER — Other Ambulatory Visit: Payer: Self-pay | Admitting: Surgical

## 2020-12-13 ENCOUNTER — Telehealth: Payer: Self-pay | Admitting: Orthopedic Surgery

## 2020-12-13 MED ORDER — TRAMADOL HCL 50 MG PO TABS: 50.0000 mg | ORAL_TABLET | Freq: Two times a day (BID) | ORAL | 0 refills | Status: DC | PRN

## 2020-12-13 NOTE — Telephone Encounter (Signed)
Called pharmacy and verified that medication was not in. This was sent previously as a "phone in" can you please change and send this electronically.

## 2020-12-13 NOTE — Telephone Encounter (Signed)
Resent with electronic prescription, thanks for catching

## 2020-12-13 NOTE — Telephone Encounter (Signed)
Patient called. Says the pharmacy says they don't have a RX there for her for Tramadol. Would like to know if the Tramadol was sent in to be filled. Her call back number is [phone]

## 2020-12-14 NOTE — Telephone Encounter (Signed)
Attempted to contact patient however had to leave a voicemail informing her that medication has been sent to her pharmacy electronically. If patient has any questions or concerns she is able to contact our office.

## 2020-12-16 ENCOUNTER — Other Ambulatory Visit: Payer: Self-pay | Admitting: Surgical

## 2020-12-16 ENCOUNTER — Telehealth: Payer: Self-pay | Admitting: Orthopedic Surgery

## 2020-12-16 MED ORDER — TRAMADOL HCL 50 MG PO TABS: 50.0000 mg | ORAL_TABLET | Freq: Two times a day (BID) | ORAL | 0 refills | Status: AC | PRN

## 2020-12-16 NOTE — Telephone Encounter (Signed)
Pt called requesting a call back. Pt states [facility] sent prescription back due to quantity of tablets. Please contact pharmacy and call pt for verification. Pt phone number is [phone].

## 2020-12-16 NOTE — Telephone Encounter (Signed)
Contacted patient and made her aware.  

## 2020-12-16 NOTE — Telephone Encounter (Signed)
Submitted 5 days worth, let me know if any issues

## 2020-12-28 ENCOUNTER — Telehealth: Payer: Self-pay | Admitting: Orthopedic Surgery

## 2020-12-28 NOTE — Telephone Encounter (Signed)
Pt called asking for a refill of her tramadol rx and would like a CB when this has been called in please.

## 2020-12-30 ENCOUNTER — Other Ambulatory Visit: Payer: Self-pay | Admitting: Surgical

## 2020-12-30 MED ORDER — TRAMADOL HCL 50 MG PO TABS: 50.0000 mg | ORAL_TABLET | Freq: Two times a day (BID) | ORAL | 0 refills | Status: AC | PRN

## 2020-12-30 NOTE — Telephone Encounter (Signed)
Tried calling to advise. No answer. LMVM

## 2020-12-30 NOTE — Telephone Encounter (Signed)
Submitting RX for tramadol but this will be the last refill, not great to be on any opioids long-term.  We can discuss other medications that may help with her knee pain if she would like, sometimes a combination of medications is necessary

## 2021-01-01 ENCOUNTER — Emergency Department (HOSPITAL_COMMUNITY): Payer: BC Managed Care – PPO

## 2021-01-01 ENCOUNTER — Emergency Department (HOSPITAL_COMMUNITY)
Admission: EM | Admit: 2021-01-01 | Discharge: 2021-01-01 | Disposition: A | Discharge status: Home / Self Care | Payer: BC Managed Care – PPO | Attending: Emergency Medicine | Admitting: Emergency Medicine

## 2021-01-01 ENCOUNTER — Encounter (HOSPITAL_COMMUNITY): Payer: Self-pay | Admitting: Emergency Medicine

## 2021-01-01 DIAGNOSIS — K5900 Constipation, unspecified: Secondary | ICD-10-CM | POA: Diagnosis not present

## 2021-01-01 DIAGNOSIS — R519 Headache, unspecified: Secondary | ICD-10-CM | POA: Diagnosis present

## 2021-01-01 DIAGNOSIS — Z5321 Procedure and treatment not carried out due to patient leaving prior to being seen by health care provider: Secondary | ICD-10-CM | POA: Insufficient documentation

## 2021-01-01 NOTE — ED Triage Notes (Signed)
Pt reports headache since Wednesday.  Reports history of headaches but states this is worse.  Also reports constipation.  Denies nausea and vomiting.  No neuro deficits noted.

## 2021-01-01 NOTE — ED Notes (Signed)
Pt left without informing staff.  

## 2021-01-01 NOTE — ED Provider Notes (Signed)
Emergency Medicine Provider Triage Evaluation Note  Amy [name] , a 55 y.o. female  was evaluated in triage.  Pt complains of headache since Wednesday. States migraine hx but this is worse. Gradual onset, unilateral, relieved by laying down. Patient has tried OTC pain relievers without relief.   Review of Systems  Positive: Headache Negative: Fevers, weakness, chills, nausea, vomiting  Physical Exam  BP 104/78 (BP Location: Left Arm)   Pulse (!) 101   Temp 98.3 F (36.8 C) (Oral)   Resp 16   SpO2 95%  Gen:   Awake, no distress  Resp:  Normal effort MSK:   Moves extremities without difficulty Other:  No focal neurological deficits noted  Medical Decision Making  Medically screening exam initiated at 1:01 PM.  Appropriate orders placed.  Amy [name] was informed that the remainder of the evaluation will be completed by another provider, this initial triage assessment does not replace that evaluation, and the importance of remaining in the ED until their evaluation is complete.  Given patient age over 54 with worse headache than baseline will order head CT.   Amy [name] 01/01/21 1304    Amy Cockayne, MD 01/05/21 [phone]

## 2021-01-18 ENCOUNTER — Telehealth: Payer: Self-pay

## 2021-01-18 NOTE — Telephone Encounter (Signed)
VOB submitted for SynviscOne, right knee. Pending BV. 

## 2021-01-19 ENCOUNTER — Telehealth: Payer: Self-pay

## 2021-01-19 NOTE — Telephone Encounter (Signed)
Tried calling patient to advise her that gel injection is not covered through her insurance, but patient's phone kept on clicking off.  No able to leave a message.

## 2021-01-20 ENCOUNTER — Telehealth: Payer: Self-pay

## 2021-01-20 NOTE — Telephone Encounter (Signed)
Pt called and wants to talk about about why her injection was not covered.   CB [phone]

## 2021-01-20 NOTE — Telephone Encounter (Signed)
Talked with patient concerning gel injections not being covered by her insurance.

## 2021-01-20 NOTE — Telephone Encounter (Signed)
Patient would like a stronger Rx called into her pharmacy?  Please advise.  Thank you.

## 2021-01-25 ENCOUNTER — Telehealth: Payer: Self-pay | Admitting: Orthopedic Surgery

## 2021-01-25 NOTE — Telephone Encounter (Signed)
Patient called. She would like to know the injection that will cost $250. Would like a call back. [phone]

## 2021-01-25 NOTE — Telephone Encounter (Signed)
Talked with patient concerning TriVisc injection.  Patient will call back when she is ready to proceed with gel injection.

## 2021-01-26 NOTE — Telephone Encounter (Signed)
No stronger RX that is indicated for arthritis that she is dealing with.  Even tramadol is not a great long-term solution to her symptoms.  We could try gabapentin in addition to tylenol and celebrex that she is currently taking.  I see she is possibly getting Trivisc injection judging by recent note. Could also consider PT-designed HEP to focus on quad strengthening exercises.  Don't think Hydrocodone or oxycodone is a good idea though

## 2021-01-27 NOTE — Telephone Encounter (Signed)
Tried calling to discuss. No answer. No VM to LM.  °

## 2021-05-20 ENCOUNTER — Ambulatory Visit: Payer: BC Managed Care – PPO | Admitting: Surgical

## 2021-06-17 ENCOUNTER — Ambulatory Visit: Payer: BC Managed Care – PPO | Admitting: Surgical

## 2021-08-12 DIAGNOSIS — E782 Mixed hyperlipidemia: Secondary | ICD-10-CM | POA: Insufficient documentation

## 2022-04-24 ENCOUNTER — Other Ambulatory Visit: Payer: Self-pay

## 2022-04-24 ENCOUNTER — Emergency Department (HOSPITAL_BASED_OUTPATIENT_CLINIC_OR_DEPARTMENT_OTHER)
Admission: EM | Admit: 2022-04-24 | Discharge: 2022-04-24 | Disposition: A | Discharge status: Home / Self Care | Payer: BC Managed Care – PPO | Attending: Emergency Medicine | Admitting: Emergency Medicine

## 2022-04-24 ENCOUNTER — Emergency Department (HOSPITAL_BASED_OUTPATIENT_CLINIC_OR_DEPARTMENT_OTHER): Payer: BC Managed Care – PPO

## 2022-04-24 ENCOUNTER — Encounter (HOSPITAL_BASED_OUTPATIENT_CLINIC_OR_DEPARTMENT_OTHER): Payer: Self-pay | Admitting: Emergency Medicine

## 2022-04-24 DIAGNOSIS — U071 COVID-19: Secondary | ICD-10-CM | POA: Diagnosis not present

## 2022-04-24 DIAGNOSIS — Z7982 Long term (current) use of aspirin: Secondary | ICD-10-CM | POA: Diagnosis not present

## 2022-04-24 DIAGNOSIS — R059 Cough, unspecified: Secondary | ICD-10-CM | POA: Diagnosis present

## 2022-04-24 LAB — RESP PANEL BY RT-PCR (RSV, FLU A&B, COVID)  RVPGX2
Influenza A by PCR: NEGATIVE
Influenza B by PCR: NEGATIVE
Resp Syncytial Virus by PCR: NEGATIVE
SARS Coronavirus 2 by RT PCR: POSITIVE — AB

## 2022-04-24 LAB — GROUP A STREP BY PCR: Group A Strep by PCR: NOT DETECTED

## 2022-04-24 MED ORDER — BENZONATATE 100 MG PO CAPS: 100.0000 mg | ORAL_CAPSULE | Freq: Three times a day (TID) | ORAL | 0 refills | Status: DC

## 2022-04-24 MED ORDER — ACETAMINOPHEN 500 MG PO TABS
1000.0000 mg | ORAL_TABLET | Freq: Once | ORAL | Status: DC
  Filled 2022-04-24: qty 2

## 2022-04-24 NOTE — ED Notes (Signed)
Pt discharged to home. Discharge instructions have been discussed with patient and/or family members. Pt verbally acknowledges understanding d/c instructions, and endorses comprehension to checkout at registration before leaving.  °

## 2022-04-24 NOTE — ED Provider Notes (Signed)
[facility] EMERGENCY DEPARTMENT Provider Note   CSN: [id number] Arrival date & time: 04/24/22  0840     History No chief complaint on file.   Amy [name] is a 56 y.o. female presenting with URI symptoms for nearly a week.  No known sick contacts but she does work at [employer].  Endorses wearing her mask.  Been having headaches, body aches and a dry cough.  Has been trying Delsym, Mucinex D, DayQuil and NyQuil.   Denies shortness of breath or chest pain.  HPI     Home Medications Prior to Admission medications   Medication Sig Start Date End Date Taking? Authorizing Provider  acetaminophen (TYLENOL) 325 MG tablet Take 1-2 tablets (325-650 mg total) by mouth every 6 (six) hours as needed for mild pain (pain score 1-3 or temp > 100.5). 06/28/18   Cammy Copa, MD  amLODipine (NORVASC) 5 MG tablet Take 5 mg by mouth daily. 03/19/15   [provider]  Ascorbic Acid (VITAMIN C PO) Take 1 tablet by mouth daily as needed (immune support).    [provider]  aspirin 81 MG chewable tablet Chew 1 tablet (81 mg total) by mouth 2 (two) times daily. 06/28/18   Cammy Copa, MD  celecoxib (CELEBREX) 100 MG capsule Take 1 capsule (100 mg total) by mouth 2 (two) times daily. 12/03/20   Magnant, Joycie Peek, PA-C  diclofenac (VOLTAREN) 75 MG EC tablet Take 75 mg by mouth daily as needed for moderate pain.    [provider]  docusate sodium (COLACE) 100 MG capsule Take 1 capsule (100 mg total) by mouth every 12 (twelve) hours. Patient taking differently: Take 100 mg by mouth daily as needed for mild constipation.  04/08/15   Cheri Fowler, PA-C  loratadine (CLARITIN) 10 MG tablet Take 10 mg by mouth daily as needed for allergies.    [provider]  losartan (COZAAR) 100 MG tablet Take 100 mg by mouth daily. 03/19/15   [provider]  methocarbamol (ROBAXIN) 500 MG tablet Take 1 tablet (500 mg total) by mouth every 6 (six) hours as  needed for muscle spasms. 06/28/18   Cammy Copa, MD  methocarbamol (ROBAXIN) 500 MG tablet Take 1 tablet (500 mg total) by mouth every 12 (twelve) hours as needed for muscle spasms. 09/11/18   Cammy Copa, MD  polyethylene glycol powder (GLYCOLAX/MIRALAX) powder Take 1 capful dissolved in 4-8 ounces of water/juice daily Patient taking differently: Take 34 g by mouth every evening.  04/08/15   Cheri Fowler, PA-C      Allergies    Patient has no known allergies.    Review of Systems   Review of Systems  Physical Exam Updated Vital Signs BP (!) 150/80   Pulse 81   Temp 99.8 F (37.7 C) (Oral)   Resp 18   Wt 86.2 kg   SpO2 97%   BMI 32.61 kg/m  Physical Exam Vitals and nursing note reviewed.  Constitutional:      Appearance: Normal appearance.  HENT:     Head: Normocephalic and atraumatic.     Mouth/Throat:     Mouth: Mucous membranes are moist.     Pharynx: Oropharynx is clear. No oropharyngeal exudate or posterior oropharyngeal erythema.  Eyes:     General: No scleral icterus.    Conjunctiva/sclera: Conjunctivae normal.  Cardiovascular:     Rate and Rhythm: Normal rate and regular rhythm.  Pulmonary:     Effort: Pulmonary effort is normal.  No respiratory distress.     Breath sounds: No wheezing.  Skin:    General: Skin is warm and dry.     Findings: No rash.  Neurological:     Mental Status: She is alert.  Psychiatric:        Mood and Affect: Mood normal.     ED Results / Procedures / Treatments   Labs (all labs ordered are listed, but only abnormal results are displayed) Labs Reviewed  RESP PANEL BY RT-PCR (RSV, FLU A&B, COVID)  RVPGX2 - Abnormal; Notable for the following components:      Result Value   SARS Coronavirus 2 by RT PCR POSITIVE (*)    All other components within normal limits  GROUP A STREP BY PCR    EKG None  Radiology DG Chest 2 View  Result Date: 04/24/2022 CLINICAL DATA:  Cough.  Upper respiratory infection.  Body aches  EXAM: CHEST - 2 VIEW COMPARISON:  01/11/2021 FINDINGS: The heart size and mediastinal contours are within normal limits. Mild bandlike opacity seen in the right lower lung, which may be due to atelectasis or scarring. No evidence of pulmonary consolidation or edema. The visualized skeletal structures are unremarkable. IMPRESSION: Mild right basilar atelectasis versus scarring. Electronically Signed   By: Danae Orleans M.D.   On: 04/24/2022 09:39    Procedures Procedures   Medications Ordered in ED Medications  acetaminophen (TYLENOL) tablet 1,000 mg (has no administration in time range)    ED Course/ Medical Decision Making/ A&P                           Medical Decision Making  56 year old female presenting today with URI symptoms.  Symptoms have been going on for.  Physical exam benign.  Patient has no accessory muscle use, is ambulatory and clear lung sounds.  Chest x-ray ordered in triage.  Viewed and interpreted by me and I agree with radiology that there are no acute findings. Tested negative for strep throat and positive for 19.  We discussed that COVID is something that needs to run its course and they may use ibuprofen, Tylenol, DayQuil/NyQuil and other over-the-counter medications for their symptoms.  Return precautions discussed and they understand that they may follow-up on their symptoms outpatient with either PCP or urgent care as needed for nonemergent symptoms.  Agreeable to discharge at this time.   Final Clinical Impression(s) / ED Diagnoses Final diagnoses:  COVID-19    Rx / DC Orders ED Discharge Orders          Ordered    benzonatate (TESSALON) 100 MG capsule  Every 8 hours        04/24/22 0950           Results and diagnoses were explained to the patient. Return precautions discussed in full. Patient had no additional questions and expressed complete understanding.   This chart was dictated using voice recognition software.  Despite best efforts to proofread,   errors can occur which can change the documentation meaning.    Woodroe Chen 04/24/22 1007    Terrilee Files, MD 04/25/22 [phone]

## 2022-04-24 NOTE — ED Triage Notes (Signed)
Pt arrives pov, slow gait, c/o cough, sore throat ahd body aches with HA x 1 week

## 2022-04-24 NOTE — Discharge Instructions (Addendum)
You came to the department today due to flulike symptoms.  You tested positive for COVID-19.  This sort of viral illness is something that can be treated with over-the-counter medications like Tylenol and ibuprofen.  Other options include DayQuil/NyQuil, Mucinex for congestion, Robitussin/Delsym for cough and any other over-the-counter medications.  It is very important that you stay hydrated during this time as well.  You may use things like Powerade, Gatorade, electrolyte powders and water.  I have also sent a medication called benzonatate to your pharmacy for your cough.  We hope that you feel better and do not hesitate to return to the emergency department with any worsening symptoms, especially chest pain, shortness of breath, dizziness and loss of consciousness.

## 2022-04-27 ENCOUNTER — Telehealth (HOSPITAL_BASED_OUTPATIENT_CLINIC_OR_DEPARTMENT_OTHER): Payer: Self-pay

## 2022-04-27 NOTE — Telephone Encounter (Signed)
Received call from patient regarding not feeling better since on 04/24/22 dx with covid. Spoke with EDP if pt SOB needs to be rechecked. To be expected not to be feeling better Cont taking tylenol/motrin, rest and fluids . Pt notified

## 2022-04-29 ENCOUNTER — Other Ambulatory Visit: Payer: Self-pay

## 2022-04-29 ENCOUNTER — Encounter (HOSPITAL_BASED_OUTPATIENT_CLINIC_OR_DEPARTMENT_OTHER): Payer: Self-pay | Admitting: Emergency Medicine

## 2022-04-29 ENCOUNTER — Emergency Department (HOSPITAL_BASED_OUTPATIENT_CLINIC_OR_DEPARTMENT_OTHER): Payer: BC Managed Care – PPO

## 2022-04-29 ENCOUNTER — Emergency Department (HOSPITAL_BASED_OUTPATIENT_CLINIC_OR_DEPARTMENT_OTHER)
Admission: EM | Admit: 2022-04-29 | Discharge: 2022-04-29 | Disposition: A | Discharge status: Home / Self Care | Payer: BC Managed Care – PPO | Attending: Emergency Medicine | Admitting: Emergency Medicine

## 2022-04-29 DIAGNOSIS — Z79899 Other long term (current) drug therapy: Secondary | ICD-10-CM | POA: Diagnosis not present

## 2022-04-29 DIAGNOSIS — U071 COVID-19: Secondary | ICD-10-CM | POA: Diagnosis not present

## 2022-04-29 DIAGNOSIS — J189 Pneumonia, unspecified organism: Secondary | ICD-10-CM | POA: Diagnosis not present

## 2022-04-29 DIAGNOSIS — Z7982 Long term (current) use of aspirin: Secondary | ICD-10-CM | POA: Insufficient documentation

## 2022-04-29 DIAGNOSIS — I10 Essential (primary) hypertension: Secondary | ICD-10-CM | POA: Diagnosis not present

## 2022-04-29 DIAGNOSIS — R0602 Shortness of breath: Secondary | ICD-10-CM | POA: Diagnosis present

## 2022-04-29 MED ORDER — ACETAMINOPHEN 325 MG PO TABS
650.0000 mg | ORAL_TABLET | Freq: Once | ORAL | Status: AC
  Administered 2022-04-29: 650 mg via ORAL
  Filled 2022-04-29: qty 2

## 2022-04-29 MED ORDER — BENZONATATE 100 MG PO CAPS: 100.0000 mg | ORAL_CAPSULE | Freq: Three times a day (TID) | ORAL | 0 refills | Status: DC

## 2022-04-29 MED ORDER — ACETAMINOPHEN ER 650 MG PO TBCR: 650.0000 mg | EXTENDED_RELEASE_TABLET | Freq: Three times a day (TID) | ORAL | 0 refills | Status: DC | PRN

## 2022-04-29 MED ORDER — ONDANSETRON HCL 4 MG PO TABS: 4.0000 mg | ORAL_TABLET | Freq: Four times a day (QID) | ORAL | 0 refills | Status: DC

## 2022-04-29 MED ORDER — IBUPROFEN 400 MG PO TABS
400.0000 mg | ORAL_TABLET | Freq: Once | ORAL | Status: AC
  Administered 2022-04-29: 400 mg via ORAL
  Filled 2022-04-29: qty 1

## 2022-04-29 MED ORDER — ACETAMINOPHEN ER 650 MG PO TBCR: 650.0000 mg | EXTENDED_RELEASE_TABLET | Freq: Three times a day (TID) | ORAL | 0 refills | Status: AC | PRN

## 2022-04-29 MED ORDER — ONDANSETRON HCL 4 MG PO TABS: 4.0000 mg | ORAL_TABLET | Freq: Four times a day (QID) | ORAL | 0 refills | Status: AC

## 2022-04-29 MED ORDER — AMOXICILLIN 500 MG PO CAPS
1000.0000 mg | ORAL_CAPSULE | Freq: Once | ORAL | Status: AC
  Administered 2022-04-29: 1000 mg via ORAL
  Filled 2022-04-29: qty 2

## 2022-04-29 MED ORDER — AMOXICILLIN 500 MG PO CAPS: 1000.0000 mg | ORAL_CAPSULE | Freq: Three times a day (TID) | ORAL | 0 refills | Status: DC

## 2022-04-29 MED ORDER — IBUPROFEN 400 MG PO TABS: 400.0000 mg | ORAL_TABLET | Freq: Four times a day (QID) | ORAL | 0 refills | Status: AC | PRN

## 2022-04-29 MED ORDER — ONDANSETRON 4 MG PO TBDP
4.0000 mg | ORAL_TABLET | Freq: Once | ORAL | Status: AC
  Administered 2022-04-29: 4 mg via ORAL
  Filled 2022-04-29: qty 1

## 2022-04-29 MED ORDER — IBUPROFEN 400 MG PO TABS: 400.0000 mg | ORAL_TABLET | Freq: Four times a day (QID) | ORAL | 0 refills | Status: DC | PRN

## 2022-04-29 MED ORDER — AMOXICILLIN 500 MG PO CAPS: 1000.0000 mg | ORAL_CAPSULE | Freq: Three times a day (TID) | ORAL | 0 refills | Status: AC

## 2022-04-29 NOTE — ED Notes (Signed)
Pt ambulated around the nurse's station twice , 02 was 96-98% and HR was 80-84 .

## 2022-04-29 NOTE — Discharge Instructions (Addendum)
You were seen in the emergency department for your worsening shortness of breath.  Your chest x-ray looks like you are starting to develop a pneumonia.  This is treated with antibiotics he should complete this as prescribed.  Your oxygen levels were normal while you are in the emergency department.  You can continue to take Tylenol or Motrin as needed for fevers or bodyaches, Zofran as needed for nausea and the Tessalon as needed for your cough.  You can follow-up with your primary care doctor in the next few days to have your symptoms rechecked.  You should return to the emergency department if you are having significantly worsening shortness of breath, severe chest pain, repetitive vomiting or if you have any other new or concerning symptoms.

## 2022-04-29 NOTE — ED Triage Notes (Signed)
Pt dx w/ Covid on 12/25; sts she is not getting better, now with [facility]

## 2022-04-29 NOTE — ED Provider Notes (Signed)
[facility] EMERGENCY DEPARTMENT Provider Note   CSN: [id number] Arrival date & time: 04/29/22  1154     History  Chief Complaint  Patient presents with   Shortness of Breath    Amy [name] is a 56 y.o. female.  Patient is a 56 year old female with a past medical history of hypertension presenting to the emergency department with shortness of breath.  The patient was seen in the ED few days ago and tested positive for COVID-19.  She states that since she has been home she has had worsening shortness of breath especially on exertion.  She states that she still has a cough.  She states she has not had any fevers.  She states that she has had some mild chest pain that is worse when she coughs.  She states that she has been nauseous with occasional vomiting.  She denies any diarrhea.  She states that she was outside the window for COVID treatment and so has just been taking Tessalon Perles to help with her cough.  The history is provided by the patient and the spouse.  Shortness of Breath      Home Medications Prior to Admission medications   Medication Sig Start Date End Date Taking? Authorizing Provider  acetaminophen (TYLENOL 8 HOUR) 650 MG CR tablet Take 1 tablet (650 mg total) by mouth every 8 (eight) hours as needed for pain. 04/29/22  Yes Theresia Lo, Turkey K, DO  amoxicillin (AMOXIL) 500 MG capsule Take 2 capsules (1,000 mg total) by mouth 3 (three) times daily for 5 days. 04/29/22 05/04/22 Yes Kingsley, Benetta Spar K, DO  benzonatate (TESSALON) 100 MG capsule Take 1 capsule (100 mg total) by mouth every 8 (eight) hours. 04/29/22  Yes Theresia Lo, Benetta Spar K, DO  ibuprofen (ADVIL) 400 MG tablet Take 1 tablet (400 mg total) by mouth every 6 (six) hours as needed. 04/29/22  Yes Theresia Lo, Turkey K, DO  ondansetron (ZOFRAN) 4 MG tablet Take 1 tablet (4 mg total) by mouth every 6 (six) hours. 04/29/22  Yes Theresia Lo, Turkey K, DO  amLODipine (NORVASC) 5 MG tablet Take 5 mg by  mouth daily. 03/19/15   [provider]  Ascorbic Acid (VITAMIN C PO) Take 1 tablet by mouth daily as needed (immune support).    [provider]  aspirin 81 MG chewable tablet Chew 1 tablet (81 mg total) by mouth 2 (two) times daily. 06/28/18   Cammy Copa, MD  benzonatate (TESSALON) 100 MG capsule Take 1 capsule (100 mg total) by mouth every 8 (eight) hours. 04/24/22   Redwine, Madison A, PA-C  celecoxib (CELEBREX) 100 MG capsule Take 1 capsule (100 mg total) by mouth 2 (two) times daily. 12/03/20   Magnant, Joycie Peek, PA-C  diclofenac (VOLTAREN) 75 MG EC tablet Take 75 mg by mouth daily as needed for moderate pain.    [provider]  docusate sodium (COLACE) 100 MG capsule Take 1 capsule (100 mg total) by mouth every 12 (twelve) hours. Patient taking differently: Take 100 mg by mouth daily as needed for mild constipation.  04/08/15   Cheri Fowler, PA-C  loratadine (CLARITIN) 10 MG tablet Take 10 mg by mouth daily as needed for allergies.    [provider]  losartan (COZAAR) 100 MG tablet Take 100 mg by mouth daily. 03/19/15   [provider]  methocarbamol (ROBAXIN) 500 MG tablet Take 1 tablet (500 mg total) by mouth every 6 (six) hours as needed for muscle spasms. 06/28/18   Rise Paganini  Lorin Picket, MD  methocarbamol (ROBAXIN) 500 MG tablet Take 1 tablet (500 mg total) by mouth every 12 (twelve) hours as needed for muscle spasms. 09/11/18   Cammy Copa, MD  polyethylene glycol powder (GLYCOLAX/MIRALAX) powder Take 1 capful dissolved in 4-8 ounces of water/juice daily Patient taking differently: Take 34 g by mouth every evening.  04/08/15   Cheri Fowler, PA-C      Allergies    Patient has no known allergies.    Review of Systems   Review of Systems  Respiratory:  Positive for shortness of breath.     Physical Exam Updated Vital Signs BP 126/71   Pulse 60   Temp 99 F (37.2 C)   Resp (!) 26   Ht 5\' 4"  (1.626 m)   Wt 86.2 kg   SpO2  94%   BMI 32.61 kg/m  Physical Exam Vitals and nursing note reviewed.  Constitutional:      General: She is not in acute distress.    Appearance: She is well-developed.  HENT:     Head: Normocephalic and atraumatic.     Mouth/Throat:     Mouth: Mucous membranes are moist.     Pharynx: Oropharynx is clear.  Eyes:     Extraocular Movements: Extraocular movements intact.  Cardiovascular:     Rate and Rhythm: Normal rate and regular rhythm.     Pulses: Normal pulses.     Heart sounds: Normal heart sounds.  Pulmonary:     Effort: Pulmonary effort is normal.     Breath sounds: Normal breath sounds.  Chest:     Chest wall: No tenderness.  Abdominal:     Palpations: Abdomen is soft.     Tenderness: There is no abdominal tenderness.  Musculoskeletal:        General: Normal range of motion.     Cervical back: Normal range of motion and neck supple.     Right lower leg: No edema.     Left lower leg: No edema.  Skin:    General: Skin is warm and dry.  Neurological:     General: No focal deficit present.     Mental Status: She is alert and oriented to person, place, and time.  Psychiatric:        Mood and Affect: Mood normal.        Behavior: Behavior normal.     ED Results / Procedures / Treatments   Labs (all labs ordered are listed, but only abnormal results are displayed) Labs Reviewed - No data to display  EKG EKG Interpretation  Date/Time:  Saturday April 29 2022 13:48:04 EST Ventricular Rate:  65 PR Interval:  124 QRS Duration: 96 QT Interval:  383 QTC Calculation: 399 R Axis:   55 Text Interpretation: Sinus rhythm No significant change since last tracing Confirmed by [id number] ([id number]) on 04/29/2022 2:10:54 PM  Radiology DG Chest 2 View  Result Date: 04/29/2022 CLINICAL DATA:  Cough.  Shortness of breath. EXAM: CHEST - 2 VIEW COMPARISON:  Two-view chest x-ray 04/24/2022. FINDINGS: Heart size is normal. Low lung volumes present. Ill-defined right  infrahilar airspace opacities are slightly more prominent than on the prior exam. Visualized soft tissues and bony thorax are unremarkable. IMPRESSION: 1. Ill-defined right infrahilar airspace opacities are slightly more prominent than on the prior exam. This is a low volume chest x-ray in this most likely represents atelectasis. Infection is not excluded. Electronically Signed   By: 04/26/2022 M.D.   On: 04/29/2022 12:29  Procedures Procedures    Medications Ordered in ED Medications  acetaminophen (TYLENOL) tablet 650 mg (650 mg Oral Given 04/29/22 1358)  ibuprofen (ADVIL) tablet 400 mg (400 mg Oral Given 04/29/22 1357)  ondansetron (ZOFRAN-ODT) disintegrating tablet 4 mg (4 mg Oral Given 04/29/22 1359)  amoxicillin (AMOXIL) capsule 1,000 mg (1,000 mg Oral Given 04/29/22 1357)    ED Course/ Medical Decision Making/ A&P                           Medical Decision Making This patient presents to the ED with chief complaint(s) of shortness of breath with pertinent past medical history of HTN, recent COVID infection which further complicates the presenting complaint. The complaint involves an extensive differential diagnosis and also carries with it a high risk of complications and morbidity.    The differential diagnosis includes considering pneumonia, pulmonary edema, pleural effusion, ACS, arrhythmia, pericarditis, PE less likely as patient does not hypoxic or tachycardic and has no lower extremity swelling and is low risk by Wells criteria  Additional history obtained: Additional history obtained from spouse Records reviewed recent ED records  ED Course and Reassessment: Patient was initially evaluated by triage and had x-ray performed that showed possible right lower lobe infiltrate concerning for pneumonia.  Patient had EKG performed that showed no signs of arrhythmia or pericarditis or other ischemic changes.  Ambulatory pulse ox was performed and she remained satting well  on room air without any increased work of breathing.  Patient will be treated with antibiotics and will be given additional symptomatic control.  She was recommended primary care follow-up and was given strict return precautions.  Independent labs interpretation:  N/A  Independent visualization of imaging: - I independently visualized the following imaging with scope of interpretation limited to determining acute life threatening conditions related to emergency care: CXR, which revealed RLL infiltrate  Consultation: - Consulted or discussed management/test interpretation w/ external professional: N/A  Consideration for admission or further workup: Patient has no emergent conditions requiring admission or further work-up at this time and is stable for discharge home with primary care follow-up  Social Determinants of health: N/A    Amount and/or Complexity of Data Reviewed Radiology: ordered.  Risk OTC drugs. Prescription drug management.          Final Clinical Impression(s) / ED Diagnoses Final diagnoses:  Community acquired pneumonia of right lower lobe of lung  COVID-19    Rx / DC Orders ED Discharge Orders          Ordered    amoxicillin (AMOXIL) 500 MG capsule  3 times daily        04/29/22 1519    benzonatate (TESSALON) 100 MG capsule  Every 8 hours        04/29/22 1521    ondansetron (ZOFRAN) 4 MG tablet  Every 6 hours        04/29/22 1521    acetaminophen (TYLENOL 8 HOUR) 650 MG CR tablet  Every 8 hours PRN        04/29/22 1521    ibuprofen (ADVIL) 400 MG tablet  Every 6 hours PRN        04/29/22 1521              Rexford Maus, DO 04/29/22 1522

## 2022-07-21 ENCOUNTER — Ambulatory Visit: Payer: BC Managed Care – PPO | Admitting: Surgical

## 2022-12-28 IMAGING — CT CT HEAD W/O CM
4 series · 16 of 47 positions shown, 18 images · non-contrast
Comparison: None.

CLINICAL DATA: Headache, new or worsening (Age >= 50y)

EXAM:
CT HEAD WITHOUT CONTRAST
TECHNIQUE: Contiguous axial images were obtained from the base of the skull
through the vertex without intravenous contrast.

[Series 2: head wo · axial · 0.42mm/px · z∈[-134,-28]mm · 7 of 29 slices shown, 9 images]
[im 4/29  brain]
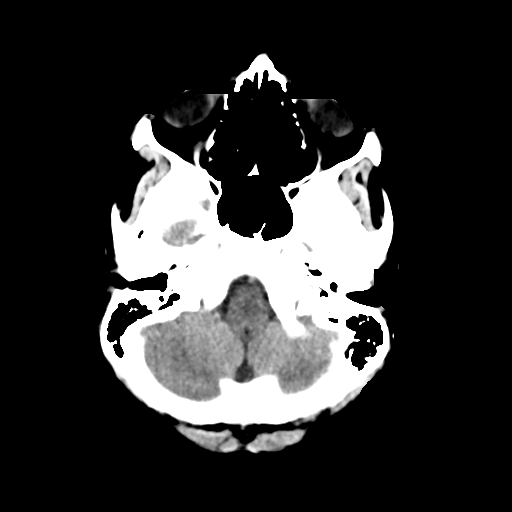
[im 4/29  bone]
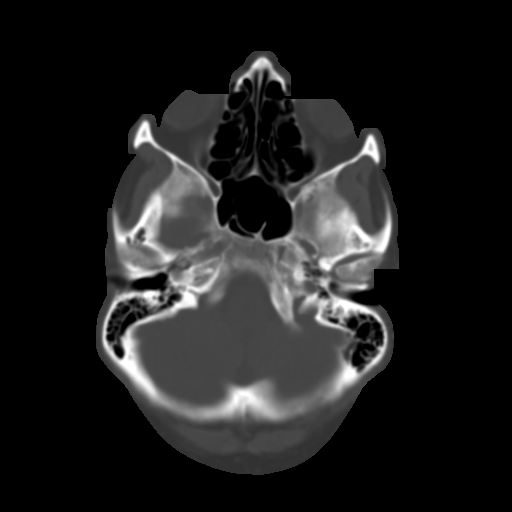
[im 8/29  brain]
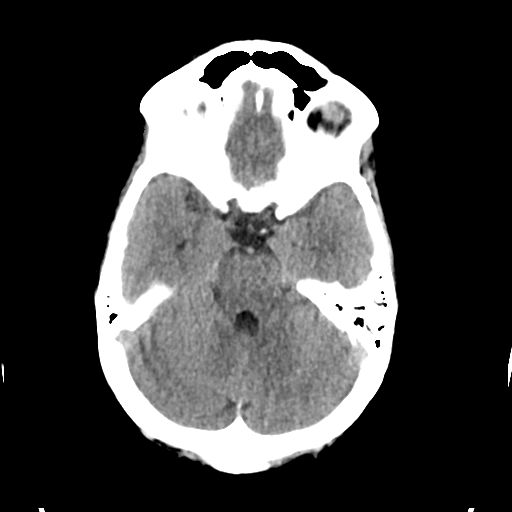
[im 11/29  brain]
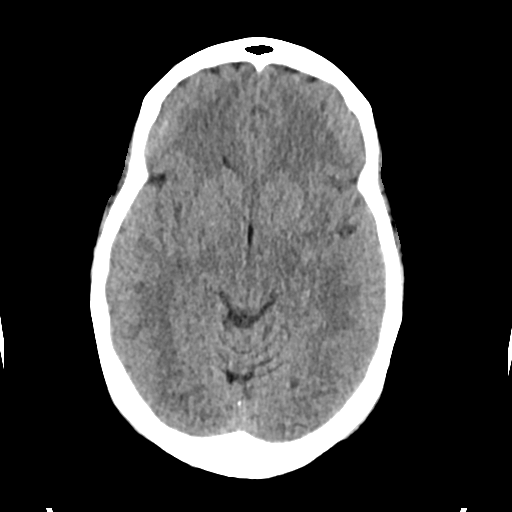
[im 15/29  brain]
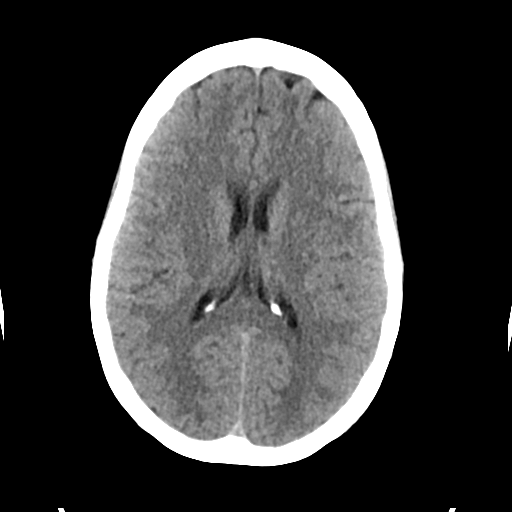
[im 18/29  brain]
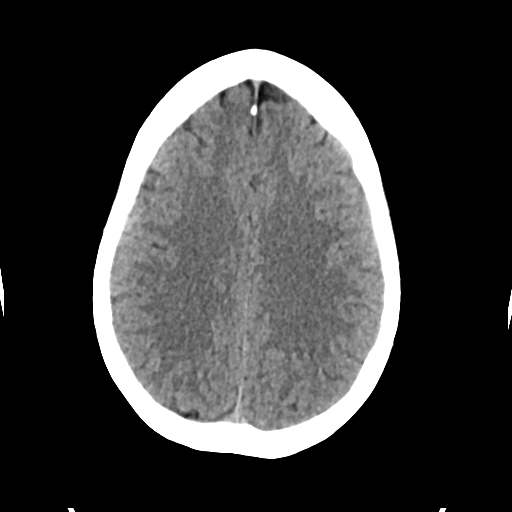
[im 18/29  bone]
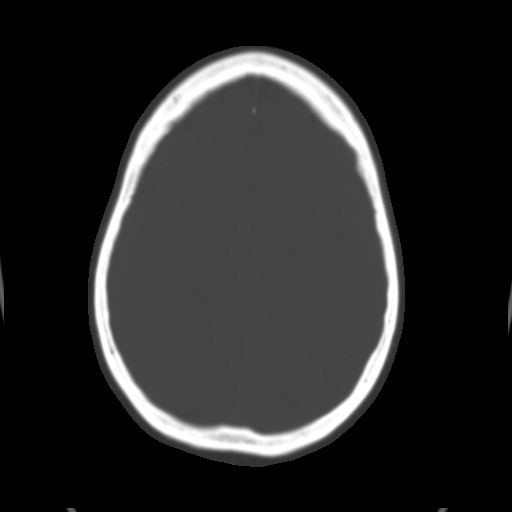
[im 22/29  brain]
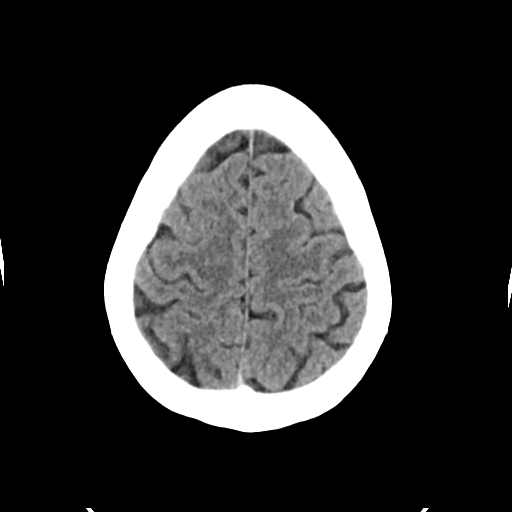
[im 25/29  brain]
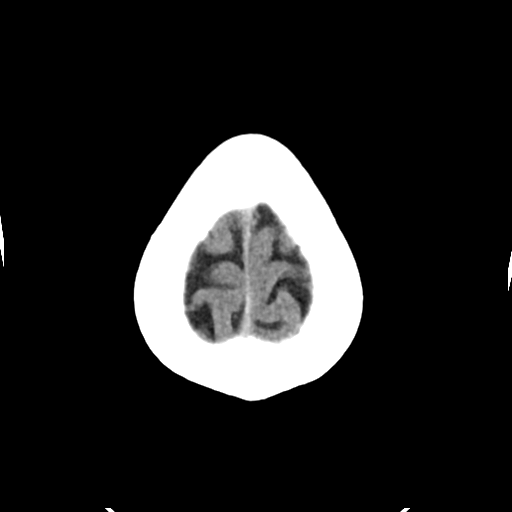

[Series 3: head bone · axial · 0.42mm/px · z∈[-134,-106]mm · 3 of 72 slices shown]
[im 8/72  bone]
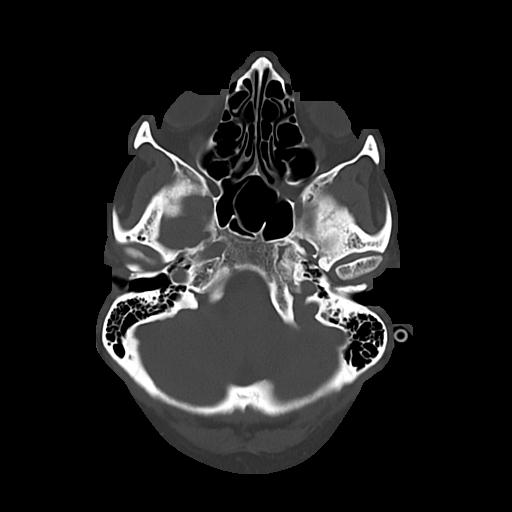
[im 15/72  bone]
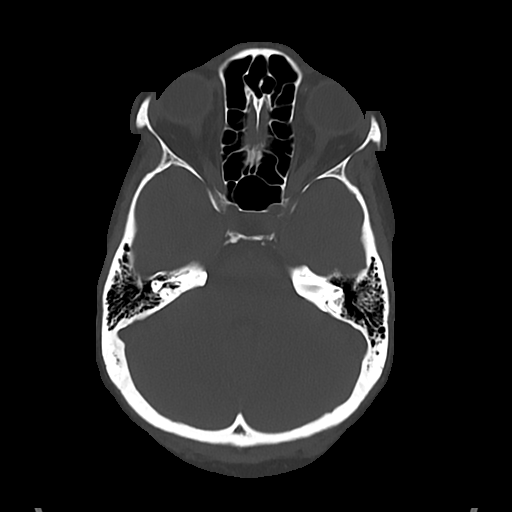
[im 22/72  bone]
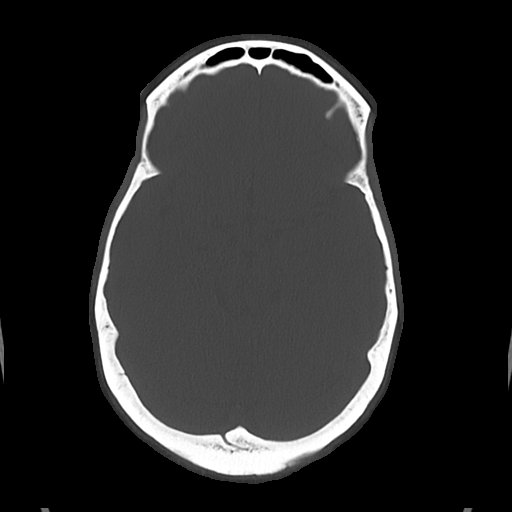

[Series 4: cor soft · coronal · 0.29mm/px · 3 of 66 slices shown]
[im 22/66  brain]
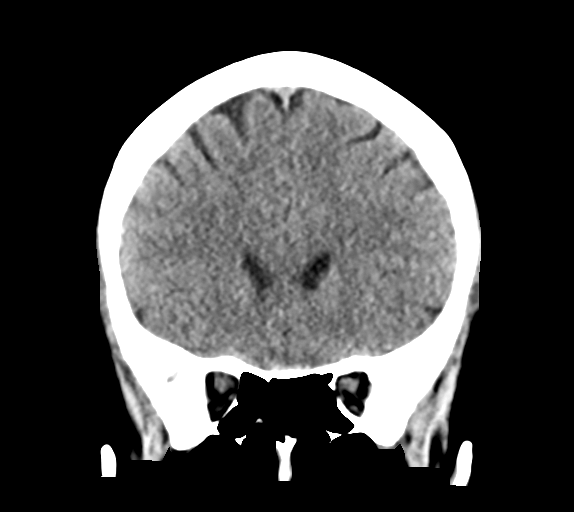
[im 29/66  brain]
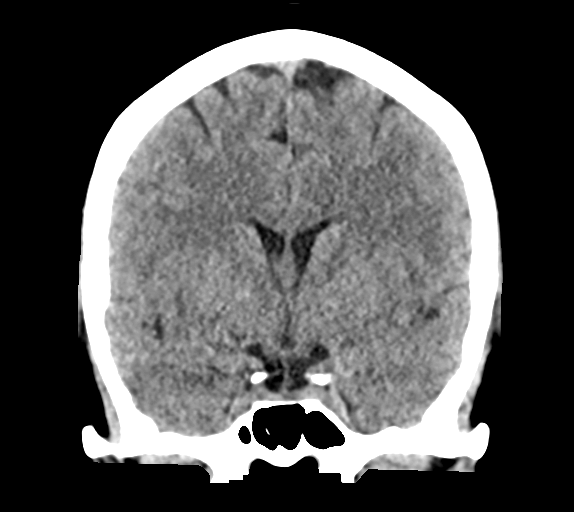
[im 37/66  brain]
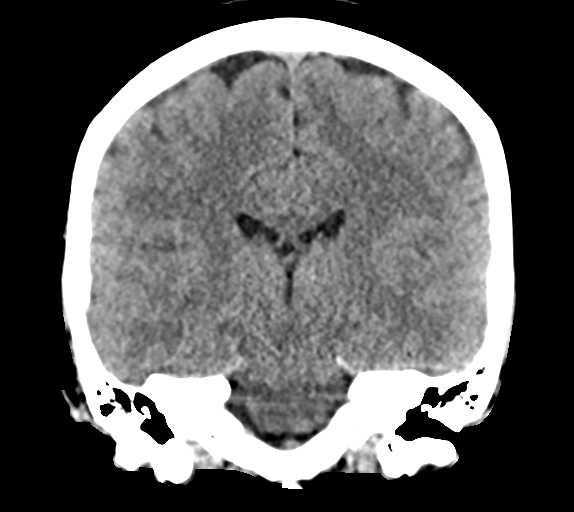

[Series 5: sag soft · sagittal · 0.29mm/px · 3 of 56 slices shown]
[im 19/56  brain]
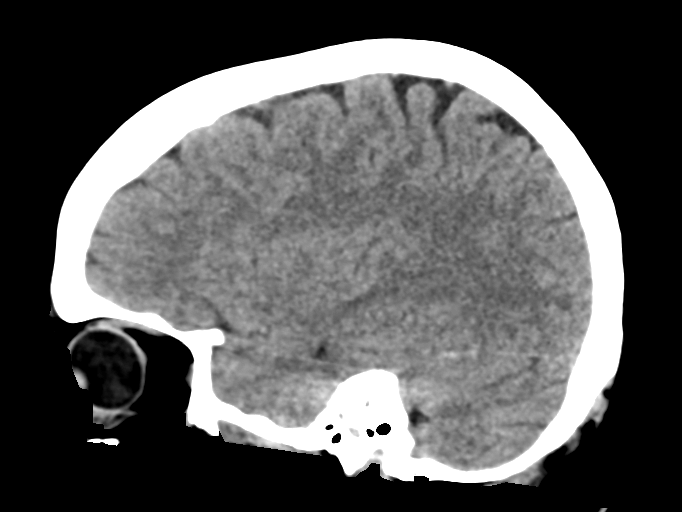
[im 28/56  brain]
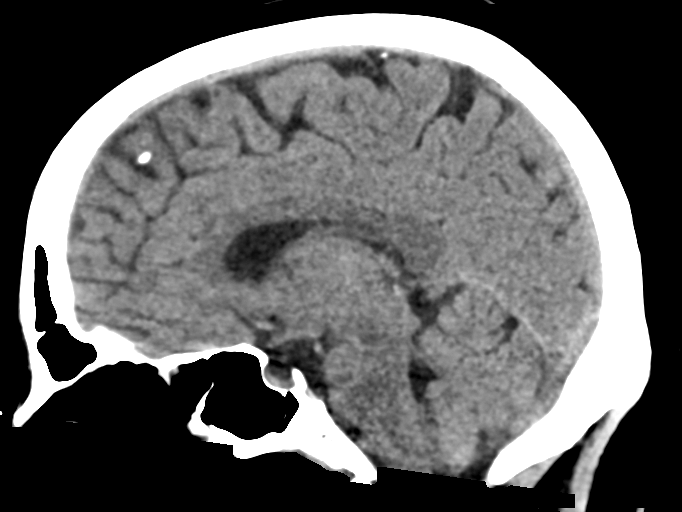
[im 37/56  brain]
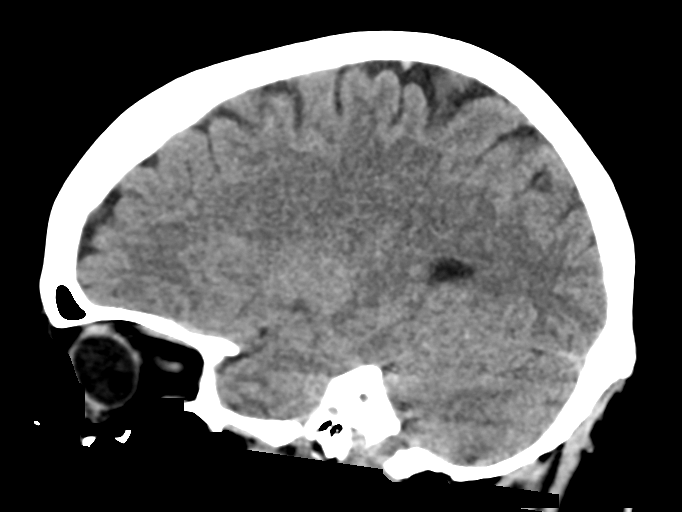

[16 of 47 positions shown; findings below may reference images not displayed]

FINDINGS: Brain: No evidence of acute intracranial hemorrhage or extra-axial
collection.No evidence of mass lesion/concern mass effect.The
ventricles are normal in size.

Vascular: No hyperdense vessel or unexpected calcification.

Skull: Normal. Negative for fracture or focal lesion.

Sinuses/Orbits: No acute finding.

Other: None.
IMPRESSION: No acute intracranial abnormality.

## 2023-04-08 ENCOUNTER — Emergency Department (HOSPITAL_BASED_OUTPATIENT_CLINIC_OR_DEPARTMENT_OTHER)
Admission: EM | Admit: 2023-04-08 | Discharge: 2023-04-08 | Disposition: A | Discharge status: Home / Self Care | Payer: BC Managed Care – PPO | Attending: Emergency Medicine | Admitting: Emergency Medicine

## 2023-04-08 ENCOUNTER — Other Ambulatory Visit: Payer: Self-pay

## 2023-04-08 DIAGNOSIS — Z7982 Long term (current) use of aspirin: Secondary | ICD-10-CM | POA: Diagnosis not present

## 2023-04-08 DIAGNOSIS — J069 Acute upper respiratory infection, unspecified: Secondary | ICD-10-CM | POA: Diagnosis not present

## 2023-04-08 DIAGNOSIS — Z20822 Contact with and (suspected) exposure to covid-19: Secondary | ICD-10-CM | POA: Diagnosis not present

## 2023-04-08 DIAGNOSIS — R0981 Nasal congestion: Secondary | ICD-10-CM | POA: Diagnosis present

## 2023-04-08 LAB — RESP PANEL BY RT-PCR (RSV, FLU A&B, COVID)  RVPGX2
Influenza A by PCR: NEGATIVE
Influenza B by PCR: NEGATIVE
Resp Syncytial Virus by PCR: NEGATIVE
SARS Coronavirus 2 by RT PCR: NEGATIVE

## 2023-04-08 LAB — GROUP A STREP BY PCR: Group A Strep by PCR: NOT DETECTED

## 2023-04-08 MED ORDER — DEXAMETHASONE SODIUM PHOSPHATE 10 MG/ML IJ SOLN
10.0000 mg | Freq: Once | INTRAMUSCULAR | Status: AC
  Administered 2023-04-08: 10 mg via INTRAMUSCULAR
  Filled 2023-04-08: qty 1

## 2023-04-08 MED ORDER — AZITHROMYCIN 250 MG PO TABS: ORAL_TABLET | ORAL | 0 refills | Status: AC

## 2023-04-08 MED ORDER — BENZONATATE 100 MG PO CAPS: 100.0000 mg | ORAL_CAPSULE | Freq: Three times a day (TID) | ORAL | 0 refills | Status: AC

## 2023-04-08 MED ORDER — KETOROLAC TROMETHAMINE 15 MG/ML IJ SOLN
15.0000 mg | Freq: Once | INTRAMUSCULAR | Status: AC
  Administered 2023-04-08: 15 mg via INTRAMUSCULAR
  Filled 2023-04-08: qty 1

## 2023-04-08 NOTE — ED Provider Notes (Signed)
[place] [facility] Provider Note   CSN: [id number] Arrival date & time: 04/08/23  0913     History  Chief Complaint  Patient presents with   Nasal Congestion    Amy [name] is a 57 y.o. female.  Patient with no pertinent past medical history presents today with complaints of cough and congestion.  He states that same began 5 days ago and has been persistent since then.  She has been trying Mucinex and TheraFlu with minimal improvement.  Also endorses body aches.  Denies any fevers.  No nausea, vomiting, chest pain, shortness of breath.  No known sick contacts.  The history is provided by the patient. No language interpreter was used.       Home Medications Prior to Admission medications   Medication Sig Start Date End Date Taking? Authorizing Provider  acetaminophen (TYLENOL 8 HOUR) 650 MG CR tablet Take 1 tablet (650 mg total) by mouth every 8 (eight) hours as needed for pain. 04/29/22   Elayne Snare K, DO  amLODipine (NORVASC) 5 MG tablet Take 5 mg by mouth daily. 03/19/15   [provider]  Ascorbic Acid (VITAMIN C PO) Take 1 tablet by mouth daily as needed (immune support).    [provider]  aspirin 81 MG chewable tablet Chew 1 tablet (81 mg total) by mouth 2 (two) times daily. 06/28/18   Cammy Copa, MD  benzonatate (TESSALON) 100 MG capsule Take 1 capsule (100 mg total) by mouth every 8 (eight) hours. 04/24/22   Redwine, Madison A, PA-C  benzonatate (TESSALON) 100 MG capsule Take 1 capsule (100 mg total) by mouth every 8 (eight) hours. 04/29/22   Elayne Snare K, DO  celecoxib (CELEBREX) 100 MG capsule Take 1 capsule (100 mg total) by mouth 2 (two) times daily. 12/03/20   Magnant, Joycie Peek, PA-C  diclofenac (VOLTAREN) 75 MG EC tablet Take 75 mg by mouth daily as needed for moderate pain.    [provider]  docusate sodium (COLACE) 100 MG capsule Take 1 capsule (100 mg total) by mouth every  12 (twelve) hours. Patient taking differently: Take 100 mg by mouth daily as needed for mild constipation.  04/08/15   Cheri Fowler, PA-C  ibuprofen (ADVIL) 400 MG tablet Take 1 tablet (400 mg total) by mouth every 6 (six) hours as needed. 04/29/22   Elayne Snare K, DO  loratadine (CLARITIN) 10 MG tablet Take 10 mg by mouth daily as needed for allergies.    [provider]  losartan (COZAAR) 100 MG tablet Take 100 mg by mouth daily. 03/19/15   [provider]  methocarbamol (ROBAXIN) 500 MG tablet Take 1 tablet (500 mg total) by mouth every 6 (six) hours as needed for muscle spasms. 06/28/18   Cammy Copa, MD  methocarbamol (ROBAXIN) 500 MG tablet Take 1 tablet (500 mg total) by mouth every 12 (twelve) hours as needed for muscle spasms. 09/11/18   Cammy Copa, MD  ondansetron (ZOFRAN) 4 MG tablet Take 1 tablet (4 mg total) by mouth every 6 (six) hours. 04/29/22   Elayne Snare K, DO  polyethylene glycol powder (GLYCOLAX/MIRALAX) powder Take 1 capful dissolved in 4-8 ounces of water/juice daily Patient taking differently: Take 34 g by mouth every evening.  04/08/15   Cheri Fowler, PA-C      Allergies    Patient has no known allergies.    Review of Systems   Review of Systems  HENT:  Positive for  congestion.   Respiratory:  Positive for cough.   All other systems reviewed and are negative.   Physical Exam Updated Vital Signs BP 123/67 (BP Location: Right Arm)   Pulse 85   Temp 98.7 F (37.1 C) (Oral)   Resp 18   SpO2 97%  Physical Exam Vitals and nursing note reviewed.  Constitutional:      General: She is not in acute distress.    Appearance: Normal appearance. She is normal weight. She is not ill-appearing, toxic-appearing or diaphoretic.  HENT:     Head: Normocephalic and atraumatic.     Mouth/Throat:     Pharynx: Uvula midline.     Tonsils: No tonsillar exudate or tonsillar abscesses. 1+ on the right. 1+ on the left.  Neck:      Comments: No meningismus Cardiovascular:     Rate and Rhythm: Normal rate and regular rhythm.     Heart sounds: Normal heart sounds.  Pulmonary:     Effort: Pulmonary effort is normal. No respiratory distress.     Breath sounds: Normal breath sounds.  Abdominal:     General: Abdomen is flat.     Palpations: Abdomen is soft.     Tenderness: There is no abdominal tenderness.  Musculoskeletal:        General: Normal range of motion.     Cervical back: Normal range of motion and neck supple.  Skin:    General: Skin is warm and dry.  Neurological:     General: No focal deficit present.     Mental Status: She is alert.  Psychiatric:        Mood and Affect: Mood normal.        Behavior: Behavior normal.     ED Results / Procedures / Treatments   Labs (all labs ordered are listed, but only abnormal results are displayed) Labs Reviewed  RESP PANEL BY RT-PCR (RSV, FLU A&B, COVID)  RVPGX2  GROUP A STREP BY PCR    EKG None  Radiology No results found.  Procedures Procedures    Medications Ordered in ED Medications  dexamethasone (DECADRON) injection 10 mg (10 mg Intramuscular Given 04/08/23 1100)  ketorolac (TORADOL) 15 MG/ML injection 15 mg (15 mg Intramuscular Given 04/08/23 1100)    ED Course/ Medical Decision Making/ A&P                                 Medical Decision Making Risk Prescription drug management.   Patient presents today with complaints of cough and congestion x 5 days.  They are afebrile, nontoxic-appearing, and in no acute distress with reassuring vital signs.  Physical exam reveals lung sounds clear to auscultation in all fields. No indication for CXR imaging at this time. Patient negative for COVID, flu, RSV, and strep.  However, patient's symptoms likely due to URI. Will send for azithromycin for bacterial coverage given patients age and per her request. Will also send for Tessalon and recommend close outpatient follow-up with return precautions.  Evaluation and diagnostic testing in the emergency department does not suggest an emergent condition requiring admission or immediate intervention beyond what has been performed at this time.  Plan for discharge with close PCP follow-up.  Patient is understanding and amenable with plan, educated on red flag symptoms that would prompt immediate return.  Patient discharged in stable condition.  Final Clinical Impression(s) / ED Diagnoses Final diagnoses:  Upper respiratory tract infection, unspecified type  Rx / DC Orders ED Discharge Orders          Ordered    azithromycin (ZITHROMAX Z-PAK) 250 MG tablet        04/08/23 1134    benzonatate (TESSALON) 100 MG capsule  Every 8 hours        04/08/23 1135          An After Visit Summary was printed and given to the patient.     Vear Clock 04/08/23 1137    Vanetta Mulders, MD 04/10/23 [phone]

## 2023-04-08 NOTE — Discharge Instructions (Signed)
Your work-up in the ER today was reassuring for acute findings. You were swabbed for COVID, flu, RSV, and strep which were negative. However, your symptoms are still likely related to an upper respiratory infection.  These are almost always viral in nature, however per your request have given you a prescription for azithromycin for you to take as prescribed in its entirety.  Given that it is likely a virus, this may or may not help you.  I recommend that you get plenty of rest and focus on symptomatic relief which includes Cepacol throat lozenges for sore throat, Mucinex DM (green box) and tylenol/ibuprofen as needed for fevers and bodyaches. I have also given you a prescription for tessalon which is a cough suppressant medication for you to take as prescribed as needed for management of your symptoms. I also recommend:  Increased fluid intake. Sports drinks offer valuable electrolytes, sugars, and fluids.  Breathing heated mist or steam (vaporizer or shower).  Eating chicken soup or other clear broths, and maintaining good nutrition.   Increasing usage of your inhaler if you have asthma.  Return to work when your temperature has returned to normal.  Gargle warm salt water and spit it out for sore throat. Take benadryl or Zyrtec to decrease sinus secretions.  Follow Up: Follow up with your primary care doctor in 5-7 days for recheck of ongoing symptoms.  Return to emergency department for emergent changing or worsening of symptoms.

## 2023-04-08 NOTE — ED Triage Notes (Signed)
Reports cold/ flu like symptoms , and body aches that started 12/3. Denies NVD and fevers.  Taking mucinex, theraflu with no relief

## 2023-08-22 ENCOUNTER — Ambulatory Visit: Admitting: Surgical

## 2023-09-07 ENCOUNTER — Other Ambulatory Visit: Payer: Self-pay | Admitting: Orthopedic Surgery

## 2023-09-07 ENCOUNTER — Encounter: Payer: Self-pay | Admitting: Orthopedic Surgery

## 2023-09-07 ENCOUNTER — Other Ambulatory Visit (INDEPENDENT_AMBULATORY_CARE_PROVIDER_SITE_OTHER): Payer: Self-pay

## 2023-09-07 ENCOUNTER — Ambulatory Visit: Admitting: Orthopedic Surgery

## 2023-09-07 ENCOUNTER — Telehealth: Payer: Self-pay | Admitting: Radiology

## 2023-09-07 ENCOUNTER — Telehealth: Payer: Self-pay

## 2023-09-07 ENCOUNTER — Telehealth: Payer: Self-pay | Admitting: Orthopedic Surgery

## 2023-09-07 DIAGNOSIS — M25572 Pain in left ankle and joints of left foot: Secondary | ICD-10-CM

## 2023-09-07 MED ORDER — TRAMADOL HCL 50 MG PO TABS: 100.0000 mg | ORAL_TABLET | Freq: Three times a day (TID) | ORAL | 0 refills | Status: DC | PRN

## 2023-09-07 MED ORDER — TRAMADOL HCL 50 MG PO TABS: ORAL_TABLET | ORAL | 0 refills | Status: DC

## 2023-09-07 NOTE — Telephone Encounter (Signed)
 Meds sent    ok for 2 tabs also for 100 mg total

## 2023-09-07 NOTE — Telephone Encounter (Signed)
 I called patient and advised.

## 2023-09-07 NOTE — Progress Notes (Unsigned)
 Office Visit Note   Patient: Amy [name]           Date of Birth: Jan 01, 1966           MRN: [id number] Visit Date: 09/07/2023 Requested by: Belva Boyden, PA [address] [place],  [place] [address] PCP: Belva Boyden, PA  Subjective: Chief Complaint  Patient presents with   Other    Complains of left foot and ankle pain and severe burning for greater than 2 years. States that she can feel fluid in her ankle/foot. Has seen several providers for this over the last few years without relief.  Tried a fracture boot, meloxicam, lyrica, neurontin  all without relief. New xrays ordered today per their request     HPI: Amy [name] is a 58 y.o. female who presents to the office reporting left foot and ankle pain.  She reports primarily medial sided pain.  Hurts her to walk.  Elevation helps.  She works at [employer] which involves a lot of standing.  She does have very good shoes and inserts in the shoes.  She has been wearing a fracture boot at work which is making her foot hurt little bit more.  She has seen a podiatrist.  Has tried Mobic and tramadol  without much relief.  Does not report any right sided symptoms..                ROS: All systems reviewed are negative as they relate to the chief complaint within the history of present illness.  Patient denies fevers or chills.  Assessment & Plan: Visit Diagnoses:  1. 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  Plan for nerve conduction study left foot to evaluate burning pain on the medial side only.  Tarsal tunnel syndrome possible but unlikely.  MRI left ankle indicated to evaluate the status of that posterior tib tendon.  She really cannot stand up on her toes.  Follow-up after those studies.  Tramadol  prescribed.  Follow-Up Instructions: No follow-ups on file.   Orders:  Orders Placed This  Encounter  Procedures   XR Ankle Complete Left   XR Foot Complete Left   MR Ankle Left w/o contrast   Ambulatory referral to Physical Medicine Rehab   Meds ordered this encounter  Medications   traMADol  (ULTRAM ) 50 MG tablet    Sig: 1 po q 8hrs prn pain    Dispense:  30 tablet    Refill:  0      Procedures: No procedures performed   Clinical Data: No additional findings.  Objective: Vital Signs: There were no vitals taken for this visit.  Physical Exam:  Constitutional: Patient appears well-developed HEENT:  Head: Normocephalic Eyes:EOM are normal Neck: Normal range of motion Cardiovascular: Normal rate Pulmonary/chest: Effort normal Neurologic: Patient is alert Skin: Skin is warm Psychiatric: Patient has normal mood and affect  Ortho Exam: Ortho exam demonstrates some collapse of that left ankle midfoot when standing.  Less so on the right-hand side.  She has palpable nontender and intact anterior tib peroneal and Achilles tendons.  Posterior tib tendon does have some slight tenderness to palpation on the left but not the right.  He has pretty reasonable inversion strength bilaterally but is unable to obtain heel inversion when she stands on her toes on the left.  Tibiotalar subtalar transverse tarsal range of motion is  symmetric between sides.  Specialty Comments:  No specialty comments available.  Imaging: XR Ankle Complete Left Result Date: 09/07/2023 AP lateral oblique radiographs left ankle reviewed.  Mortise is symmetric.  No acute fracture.  No significant tibiotalar degenerative changes noted.  XR Foot Complete Left Result Date: 09/07/2023 AP lateral oblique radiographs left foot reviewed.  Mild midfoot degenerative changes present.  No acute fracture.  Tarsometatarsal alignment intact    PMFS History: Patient Active Problem List   Diagnosis Date Noted   Unilateral primary osteoarthritis, left knee    Arthritis of knee 06/27/2018   Past Medical  History:  Diagnosis Date   Arthritis    Hypertension     History reviewed. No pertinent family history.  Past Surgical History:  Procedure Laterality Date   ABDOMINAL HYSTERECTOMY     KNEE ARTHROPLASTY Left 06/27/2018   PARTIAL REPLACEMENT   MEDIAL PARTIAL KNEE REPLACEMENT Left 06/27/2018   Procedure: LEFT KNEE PARTIAL REPLACEMENT;  Surgeon: Jasmine Mesi, MD;  Location: [facility];  Service: Orthopedics;  Laterality: Left;   WISDOM TOOTH EXTRACTION     Social History   Occupational History   Not on file  Tobacco Use   Smoking status: Never   Smokeless tobacco: Never  Vaping Use   Vaping status: Never Used  Substance and Sexual Activity   Alcohol use: No   Drug use: No   Sexual activity: Yes    Birth control/protection: Surgical

## 2023-09-07 NOTE — Telephone Encounter (Signed)
 Patient called, states that her Tramadol  was to be sent in as 100 mg, but was sent in as 50 mg. She is asking if she can take 2 tablets at time. I found Lauren and spoke with her, she sent a message to [facility] and Dr. Rozelle Corning about this. I advised the patient that this has been done, and that for now she only needs to take one tablet at a time till she hears something back.

## 2023-09-07 NOTE — Telephone Encounter (Signed)
 Pt was seen this AM with Rozelle Corning and was told Dr Rozelle Corning would send in 100 MG Tramadol  due to the 50 MG not working. Pt is asking for Dr Rozelle Corning to send in 100 MG Tramadol . Please call pt about this matter at [phone] or pt's mom [phone].

## 2023-09-07 NOTE — Telephone Encounter (Signed)
 Patient states you had said today in her appointment you would send in 100mg  tablet? Looks like 50mg  was sent. She ants to know if she can take 2 of the 50mg ?

## 2023-09-10 NOTE — Telephone Encounter (Signed)
 Nvm please ignore. Addressed in another message

## 2023-09-12 ENCOUNTER — Encounter: Payer: Self-pay | Admitting: Orthopedic Surgery

## 2023-09-13 ENCOUNTER — Encounter: Payer: Self-pay | Admitting: Orthopedic Surgery

## 2023-09-14 ENCOUNTER — Ambulatory Visit
Admission: RE | Admit: 2023-09-14 | Discharge: 2023-09-14 | Disposition: A | Discharge status: Home / Self Care | Source: Ambulatory Visit | Attending: Orthopedic Surgery | Admitting: Orthopedic Surgery

## 2023-09-14 DIAGNOSIS — M25572 Pain in left ankle and joints of left foot: Secondary | ICD-10-CM

## 2023-09-19 ENCOUNTER — Telehealth: Payer: Self-pay | Admitting: Orthopedic Surgery

## 2023-09-19 NOTE — Telephone Encounter (Signed)
 Patient called and said that the medication was sent for 100mg . The insurance wouldn't pay for the 100mg  so the pharmacy said can you write the prescription again so the insurance can cover it. [phone] Send it to [facility] on [address] in [place].

## 2023-09-20 ENCOUNTER — Ambulatory Visit: Payer: Self-pay | Admitting: Orthopedic Surgery

## 2023-09-20 NOTE — Progress Notes (Signed)
 Hi Lauren.  I left message with September about her scan.  If she calls back and wants injections please have her see Dr. Julio Ohm.  Thanks

## 2023-09-21 ENCOUNTER — Ambulatory Visit: Admitting: Physical Medicine and Rehabilitation

## 2023-09-21 DIAGNOSIS — R531 Weakness: Secondary | ICD-10-CM | POA: Diagnosis not present

## 2023-09-21 DIAGNOSIS — M25572 Pain in left ankle and joints of left foot: Secondary | ICD-10-CM | POA: Diagnosis not present

## 2023-09-21 DIAGNOSIS — R202 Paresthesia of skin: Secondary | ICD-10-CM

## 2023-09-21 NOTE — Progress Notes (Unsigned)
 Pain Scale   Average Pain 5 Patient advising  left ankle pain constant  without any relief         +Driver, -BT, -Dye Allergies.

## 2023-09-21 NOTE — Telephone Encounter (Signed)
 Dr August Saucer called with results

## 2023-09-21 NOTE — Telephone Encounter (Signed)
-----   Message from Marykay Snipes sent at 09/20/2023  5:37 PM EDT ----- Amy [name].  I left message with Tony about her scan.  If she calls back and wants injections please have her see Dr. Julio Ohm.  Thanks

## 2023-09-25 ENCOUNTER — Telehealth: Payer: Self-pay | Admitting: Orthopedic Surgery

## 2023-09-25 NOTE — Telephone Encounter (Signed)
 [facility] pharmacy called said that the tramadol  is requesting authorization but they want you to write another prescription with directions to take one tablet four times a day and will not require authorization. ZO#[phone]

## 2023-09-27 ENCOUNTER — Encounter: Payer: Self-pay | Admitting: Physical Medicine and Rehabilitation

## 2023-09-27 ENCOUNTER — Other Ambulatory Visit: Payer: Self-pay | Admitting: Orthopedic Surgery

## 2023-09-27 MED ORDER — TRAMADOL HCL 50 MG PO TABS: 50.0000 mg | ORAL_TABLET | Freq: Four times a day (QID) | ORAL | 0 refills | Status: DC

## 2023-09-27 NOTE — Progress Notes (Signed)
 Amy [name] - 58 y.o. female MRN [id number]  Date of birth: 30-May-1965  Office Visit Note: Visit Date: 09/21/2023 PCP: Belva Boyden, PA Referred by: Jasmine Mesi, MD  Subjective: Chief Complaint  Patient presents with   Left Ankle - Pain   HPI: Amy [name] is a 58 y.o. female who comes in today at the request of Dr. Marykay Snipes for evaluation and management of chronic, worsening and severe pain, numbness and tingling in the Left lower leg.  Patient is Right hand dominant.  She reports a chronic history of recalcitrant mostly pain in the left medial side of the foot and plantar surface.  No real pain on the lateral plantar area of the heel.  Nothing down the leg from the spine down.  No radicular pattern.  No real paresthesia but some tingling or dysesthesia time.  No specific swelling although she feels like she can feel fluid in the joint of the ankle.  She has seen podiatry she is seeing orthopedics she has had bracing and boot and medication really without any help at this point.  She does not have a history of diabetes or thyroid disease.  She was recently diagnosed with prediabetes.  She has had no history of lumbar spine problems or surgery.  She does not describe any foot drop or weakness.  She is status post a prior left total knee arthroplasty by Dr. Rozelle Corning in 2020.   I spent more than 30 minutes speaking face-to-face with the patient with 50% of the time in counseling and discussing coordination of care.       Review of Systems  Musculoskeletal:  Positive for joint pain.  Neurological:  Positive for tingling.  All other systems reviewed and are negative.  Otherwise per HPI.  Assessment & Plan: Visit Diagnoses:    ICD-10-CM   1. Paresthesia of skin  R20.2 NCV with EMG (electromyography)    2. Pain in left ankle and joints of left foot  M25.572     3. Weakness  R53.1        Plan: Impression: Clinically hard to piece together what is causing her  symptoms.  1 specific rule out would be tarsal tunnel syndrome.  Electrodiagnostic study performed today.  Essentially NORMAL electrodiagnostic study of the left lower limb.  There is no significant electrodiagnostic evidence of nerve entrapment, lumbar radiculopathy or lumbosacral plexopathy.  Specifically no evidence of tarsal tunnel syndrome but this test is known to be more specific than sensitive for this condition.  As you know, purely sensory or demyelinating radiculopathies and chemical radiculitis may not be detected with this particular electrodiagnostic study.  Recommendations: 1.  Follow-up with referring physician. 2.  Continue current management of symptoms.  Meds & Orders: No orders of the defined types were placed in this encounter.   Orders Placed This Encounter  Procedures   NCV with EMG (electromyography)    Follow-up: Return for  G. Lynita Saris, MD.   Procedures: No procedures performed  EMG & NCV Findings: All nerve conduction studies (as indicated in the following tables) were within normal limits.    All examined muscles (as indicated in the following table) showed no evidence of electrical instability.    Impression: Essentially NORMAL electrodiagnostic study of the left lower limb.  There is no significant electrodiagnostic evidence of nerve entrapment, lumbar radiculopathy or lumbosacral plexopathy.  Specifically no evidence of tarsal tunnel syndrome but this test is known to be more specific than sensitive for  this condition.  As you know, purely sensory or demyelinating radiculopathies and chemical radiculitis may not be detected with this particular electrodiagnostic study.  Recommendations: 1.  Follow-up with referring physician. 2.  Continue current management of symptoms.  ___________________________ Collin Deal FAAPMR Board Certified, American Board of Physical Medicine and Rehabilitation    Nerve Conduction Studies Anti Sensory Summary Table    Stim Site NR Peak (ms) Norm Peak (ms) P-T Amp (V) Norm P-T Amp Site1 Site2 Delta-P (ms) Dist (cm) Vel (m/s) Norm Vel (m/s)  Left Saphenous Anti Sensory (Ant Med Mall)  29.2C  14cm    3.8 <4.4 15.6 >2 14cm Ant Med Mall 3.8 0.0  >32  Left Sup Fibular Anti Sensory (Ant Lat Mall)  29.3C  14 cm    3.8 <4.4 13.5 >5.0 14 cm Ant Lat Mall 3.8 14.0 37 >32  Left Sural Anti Sensory (Lat Mall)  29C  Calf    3.5 <4.0 12.3 >5.0 Calf Lat Mall 3.5 14.0 40 >35   Motor Summary Table   Stim Site NR Onset (ms) Norm Onset (ms) O-P Amp (mV) Norm O-P Amp Site1 Site2 Delta-0 (ms) Dist (cm) Vel (m/s) Norm Vel (m/s)  Left Fibular Motor (Ext Dig Brev)  29.7C  Ankle    2.7 <6.1 4.4 >2.5 B Fib Ankle 6.6 29.0 44 >38  B Fib    9.3  4.2  Poplt B Fib 1.6 9.0 56 >40  Poplt    10.9  4.4         Left Tibial Motor (Abd Hall Brev)  29.6C  Ankle    3.9 <6.1 4.8 >3.0 Knee Ankle 7.3 35.0 48 >35  Knee    11.2  4.5          EMG   Side Muscle Nerve Root Ins Act Fibs Psw Amp Dur Poly Recrt Int Deatra Face Comment  Left AntTibialis Dp Br Peron L4-5 Nml Nml Nml Nml Nml 0 Nml Nml   Left Fibularis Longus  Sup Br Peron L5-S1 Nml Nml Nml Nml Nml 0 Nml Nml   Left MedGastroc Tibial S1-2 Nml Nml Nml Nml Nml 0 Nml Nml   Left VastusMed Femoral L2-4 Nml Nml Nml Nml Nml 0 Nml Nml   Left BicepsFemS Sciatic L5-S1 Nml Nml Nml Nml Nml 0 Nml Nml   Left GluteusMed SupGluteal L4-S1 Nml Nml Nml Nml Nml 0 Nml Nml     Nerve Conduction Studies Anti Sensory Left/Right Comparison   Stim Site L Lat (ms) R Lat (ms) L-R Lat (ms) L Amp (V) R Amp (V) L-R Amp (%) Site1 Site2 L Vel (m/s) R Vel (m/s) L-R Vel (m/s)  Saphenous Anti Sensory (Ant Med Mall)  29.2C  14cm 3.8   15.6   14cm Ant Med Mall     Sup Fibular Anti Sensory (Ant Lat Mall)  29.3C  14 cm 3.8   13.5   14 cm Ant Lat Mall 37    Sural Anti Sensory (Lat Mall)  29C  Calf 3.5   12.3   Calf Lat Mall 40     Motor Left/Right Comparison   Stim Site L Lat (ms) R Lat (ms) L-R Lat (ms) L Amp (mV) R  Amp (mV) L-R Amp (%) Site1 Site2 L Vel (m/s) R Vel (m/s) L-R Vel (m/s)  Fibular Motor (Ext Dig Brev)  29.7C  Ankle 2.7   4.4   B Fib Ankle 44    B Fib 9.3   4.2   Poplt B Fib 56  Poplt 10.9   4.4         Tibial Motor (Abd Hall Brev)  29.6C  Ankle 3.9   4.8   Knee Ankle 48    Knee 11.2   4.5            Waveforms:            Clinical History: No specialty comments available.   She reports that she has never smoked. She has never used smokeless tobacco. No results for input(s): "HGBA1C", "LABURIC" in the last 8760 hours.  Objective:  VS:  HT:    WT:   BMI:     BP:   HR: bpm  TEMP: ( )  RESP:  Physical Exam Vitals and nursing note reviewed.  Constitutional:      General: She is not in acute distress.    Appearance: Normal appearance. She is well-developed. She is obese. She is not ill-appearing.  HENT:     Head: Normocephalic and atraumatic.  Eyes:     Conjunctiva/sclera: Conjunctivae normal.     Pupils: Pupils are equal, round, and reactive to light.  Cardiovascular:     Rate and Rhythm: Normal rate.     Pulses: Normal pulses.  Pulmonary:     Effort: Pulmonary effort is normal.  Musculoskeletal:        General: Tenderness present. No swelling or deformity.     Right lower leg: No edema.     Left lower leg: No edema.     Comments: Examination of both feet show good intrinsic foot musculature with no atrophy of the lower limb and calf musculature bilaterally.  There is no swelling or allodynia noted.  She has a negative Tinel's across the fibular heads.  Negative Tinel's at the tarsal tunnel.  Intact sensation in all dermatomes.  Skin:    General: Skin is warm and dry.     Findings: No erythema or rash.  Neurological:     General: No focal deficit present.     Mental Status: She is alert and oriented to person, place, and time.     Cranial Nerves: No cranial nerve deficit.     Sensory: No sensory deficit.     Motor: No weakness or abnormal muscle tone.      Coordination: Coordination normal.     Gait: Gait abnormal.  Psychiatric:        Mood and Affect: Mood normal.        Behavior: Behavior normal.     Ortho Exam  Imaging: No results found.  Past Medical/Family/Surgical/Social History: Medications & Allergies reviewed per EMR, new medications updated. Patient Active Problem List   Diagnosis Date Noted   Mixed hyperlipidemia 08/12/2021   BMI 31.0-31.9,adult 06/27/2019   Seasonal allergies 06/27/2019   Unilateral primary osteoarthritis, left knee    Arthritis of knee 06/27/2018   Prediabetes 12/01/2016   Irritable bowel syndrome with constipation 07/19/2015   Benign essential hypertension 11/14/2010   Past Medical History:  Diagnosis Date   Arthritis    Hypertension    History reviewed. No pertinent family history. Past Surgical History:  Procedure Laterality Date   ABDOMINAL HYSTERECTOMY     KNEE ARTHROPLASTY Left 06/27/2018   PARTIAL REPLACEMENT   MEDIAL PARTIAL KNEE REPLACEMENT Left 06/27/2018   Procedure: LEFT KNEE PARTIAL REPLACEMENT;  Surgeon: Jasmine Mesi, MD;  Location: [facility];  Service: Orthopedics;  Laterality: Left;   WISDOM TOOTH EXTRACTION     Social History   Occupational History  Not on file  Tobacco Use   Smoking status: Never   Smokeless tobacco: Never  Vaping Use   Vaping status: Never Used  Substance and Sexual Activity   Alcohol use: No   Drug use: No   Sexual activity: Yes    Birth control/protection: Surgical

## 2023-09-27 NOTE — Telephone Encounter (Signed)
 Done thx

## 2023-09-27 NOTE — Procedures (Signed)
 EMG & NCV Findings: All nerve conduction studies (as indicated in the following tables) were within normal limits.    All examined muscles (as indicated in the following table) showed no evidence of electrical instability.    Impression: Essentially NORMAL electrodiagnostic study of the left lower limb.  There is no significant electrodiagnostic evidence of nerve entrapment, lumbar radiculopathy or lumbosacral plexopathy.  Specifically no evidence of tarsal tunnel syndrome but this test is known to be more specific than sensitive for this condition.  As you know, purely sensory or demyelinating radiculopathies and chemical radiculitis may not be detected with this particular electrodiagnostic study.  Recommendations: 1.  Follow-up with referring physician. 2.  Continue current management of symptoms.  ___________________________ Collin Deal FAAPMR Board Certified, American Board of Physical Medicine and Rehabilitation    Nerve Conduction Studies Anti Sensory Summary Table   Stim Site NR Peak (ms) Norm Peak (ms) P-T Amp (V) Norm P-T Amp Site1 Site2 Delta-P (ms) Dist (cm) Vel (m/s) Norm Vel (m/s)  Left Saphenous Anti Sensory (Ant Med Mall)  29.2C  14cm    3.8 <4.4 15.6 >2 14cm Ant Med Mall 3.8 0.0  >32  Left Sup Fibular Anti Sensory (Ant Lat Mall)  29.3C  14 cm    3.8 <4.4 13.5 >5.0 14 cm Ant Lat Mall 3.8 14.0 37 >32  Left Sural Anti Sensory (Lat Mall)  29C  Calf    3.5 <4.0 12.3 >5.0 Calf Lat Mall 3.5 14.0 40 >35   Motor Summary Table   Stim Site NR Onset (ms) Norm Onset (ms) O-P Amp (mV) Norm O-P Amp Site1 Site2 Delta-0 (ms) Dist (cm) Vel (m/s) Norm Vel (m/s)  Left Fibular Motor (Ext Dig Brev)  29.7C  Ankle    2.7 <6.1 4.4 >2.5 B Fib Ankle 6.6 29.0 44 >38  B Fib    9.3  4.2  Poplt B Fib 1.6 9.0 56 >40  Poplt    10.9  4.4         Left Tibial Motor (Abd Hall Brev)  29.6C  Ankle    3.9 <6.1 4.8 >3.0 Knee Ankle 7.3 35.0 48 >35  Knee    11.2  4.5          EMG   Side  Muscle Nerve Root Ins Act Fibs Psw Amp Dur Poly Recrt Int Deatra Face Comment  Left AntTibialis Dp Br Peron L4-5 Nml Nml Nml Nml Nml 0 Nml Nml   Left Fibularis Longus  Sup Br Peron L5-S1 Nml Nml Nml Nml Nml 0 Nml Nml   Left MedGastroc Tibial S1-2 Nml Nml Nml Nml Nml 0 Nml Nml   Left VastusMed Femoral L2-4 Nml Nml Nml Nml Nml 0 Nml Nml   Left BicepsFemS Sciatic L5-S1 Nml Nml Nml Nml Nml 0 Nml Nml   Left GluteusMed SupGluteal L4-S1 Nml Nml Nml Nml Nml 0 Nml Nml     Nerve Conduction Studies Anti Sensory Left/Right Comparison   Stim Site L Lat (ms) R Lat (ms) L-R Lat (ms) L Amp (V) R Amp (V) L-R Amp (%) Site1 Site2 L Vel (m/s) R Vel (m/s) L-R Vel (m/s)  Saphenous Anti Sensory (Ant Med Mall)  29.2C  14cm 3.8   15.6   14cm Ant Med Mall     Sup Fibular Anti Sensory (Ant Lat Mall)  29.3C  14 cm 3.8   13.5   14 cm Ant Lat Mall 37    Sural Anti Sensory (Lat Mall)  29C  Calf 3.5  12.3   Calf Lat Mall 40     Motor Left/Right Comparison   Stim Site L Lat (ms) R Lat (ms) L-R Lat (ms) L Amp (mV) R Amp (mV) L-R Amp (%) Site1 Site2 L Vel (m/s) R Vel (m/s) L-R Vel (m/s)  Fibular Motor (Ext Dig Brev)  29.7C  Ankle 2.7   4.4   B Fib Ankle 44    B Fib 9.3   4.2   Poplt B Fib 56    Poplt 10.9   4.4         Tibial Motor (Abd Hall Brev)  29.6C  Ankle 3.9   4.8   Knee Ankle 48    Knee 11.2   4.5            Waveforms:

## 2023-10-28 ENCOUNTER — Other Ambulatory Visit: Payer: Self-pay | Admitting: Orthopedic Surgery

## 2023-10-30 ENCOUNTER — Telehealth: Payer: Self-pay | Admitting: Surgical

## 2023-10-30 NOTE — Telephone Encounter (Signed)
Call patient with MRI results.

## 2023-11-01 ENCOUNTER — Ambulatory Visit: Admitting: Surgical

## 2023-11-05 NOTE — Telephone Encounter (Signed)
 Hi Betsy.  Already called and left her message about her scan maybe a month ago.  I think the plan was to see if she wanted Dr. Harden to try some injections in and around her foot and ankle.  Can you talk to her and see if she actually got my message.  Thanks

## 2023-11-12 NOTE — Telephone Encounter (Signed)
 I left voicemail x 2.

## 2023-11-23 ENCOUNTER — Other Ambulatory Visit: Payer: Self-pay

## 2023-11-23 ENCOUNTER — Ambulatory Visit: Admitting: Orthopedic Surgery

## 2023-11-23 DIAGNOSIS — M76822 Posterior tibial tendinitis, left leg: Secondary | ICD-10-CM | POA: Diagnosis not present

## 2023-11-23 DIAGNOSIS — R202 Paresthesia of skin: Secondary | ICD-10-CM

## 2023-11-23 MED ORDER — TRAMADOL HCL 50 MG PO TABS: 50.0000 mg | ORAL_TABLET | Freq: Two times a day (BID) | ORAL | 0 refills | Status: DC | PRN

## 2023-11-24 ENCOUNTER — Encounter: Payer: Self-pay | Admitting: Orthopedic Surgery

## 2023-11-24 MED ORDER — BUPIVACAINE HCL 0.5 % IJ SOLN
1.0000 mL | INTRAMUSCULAR | Status: AC | PRN
  Administered 2023-11-23: 1 mL via INTRA_ARTICULAR

## 2023-11-24 MED ORDER — LIDOCAINE HCL 1 % IJ SOLN
3.0000 mL | INTRAMUSCULAR | Status: AC | PRN
  Administered 2023-11-23: 3 mL

## 2023-11-24 NOTE — Progress Notes (Addendum)
 Office Visit Note   Patient: Amy [name]           Date of Birth: 25-Jul-1965           MRN: [id number] Visit Date: 11/23/2023 Requested by: Lesa Jon HERO, PA [address] [place],  [place] [address] PCP: Lesa Jon HERO, PA  Subjective: Chief Complaint  Patient presents with   Right Ankle - Pain    HPI: Amy [name] is a 58 y.o. female who presents to the office reporting continued medial sided left ankle pain.  Since she was last seen she has had an EMG nerve study which is negative.  Also has had MRI of the ankle on the left-hand side which shows some fluid around the posterior tib tendon sheath but otherwise negative study.  No masses around the medial or lateral aspect of the ankle.  There is a little bit of signal consistent with possible lipoma on both sides.  Has been taking tramadol  with some relief.  Does have pain with ambulation..                ROS: All systems reviewed are negative as they relate to the chief complaint within the history of present illness.  Patient denies fevers or chills.  Assessment & Plan: Visit Diagnoses:  1. Paresthesia of skin     Plan: Impression is no definite structural problem outside of some mild posterior tib tendinitis on left ankle MRI.  Does not look like tarsal tunnel or neuropathy.  Plan is ultrasound-guided posterior tib tendon injection with 1 cc of Toradol  and 2 cc of Marcaine .  Will see how that does for her.  If this is not helpful we may have to refer her to Dr. Harden for further evaluation and management of a somewhat enigmatic episode of left ankle pain.  Follow-Up Instructions: No follow-ups on file.   Orders:  Orders Placed This Encounter  Procedures   US  Guided Needle Placement - No Linked Charges   Meds ordered this encounter  Medications   traMADol  (ULTRAM ) 50 MG tablet    Sig: Take 1 tablet (50 mg total) by mouth every 12 (twelve) hours as needed.    Dispense:  30 tablet    Refill:  0       Procedures: Medium Joint Inj: L ankle on 11/23/2023 7:43 AM Indications: pain, joint swelling and diagnostic evaluation Details: 25 G 1.5 in needle, anteromedial approach Medications: 3 mL lidocaine  1 %; 1 mL bupivacaine  0.5 % (1/2 mL bupivacaine  0.5 %) Outcome: tolerated well, no immediate complications Procedure, treatment alternatives, risks and benefits explained, specific risks discussed. Consent was given by the patient. Immediately prior to procedure a time out was called to verify the correct patient, procedure, equipment, support staff and site/side marked as required. Patient was prepped and draped in the usual sterile fashion.     Toradol  injected  Clinical Data: No additional findings.  Objective: Vital Signs: There were no vitals taken for this visit.  Physical Exam:  Constitutional: Patient appears well-developed HEENT:  Head: Normocephalic Eyes:EOM are normal Neck: Normal range of motion Cardiovascular: Normal rate Pulmonary/chest: Effort normal Neurologic: Patient is alert Skin: Skin is warm Psychiatric: Patient has normal mood and affect  Ortho Exam: Ortho exam demonstrates palpable intact nontender anterior tib peroneal and Achilles tendons with mild tenderness over the posterior tib tendon.  She does have good inversion strength.  Pedal pulses intact.  No ankle instability is present.  Patient does not have  forefoot collapse when she stands.  Specialty Comments:  No specialty comments available.  Imaging: No results found.   PMFS History: Patient Active Problem List   Diagnosis Date Noted   Mixed hyperlipidemia 08/12/2021   BMI 31.0-31.9,adult 06/27/2019   Seasonal allergies 06/27/2019   Unilateral primary osteoarthritis, left knee    Arthritis of knee 06/27/2018   Prediabetes 12/01/2016   Irritable bowel syndrome with constipation 07/19/2015   Benign essential hypertension 11/14/2010   Past Medical History:  Diagnosis Date   Arthritis     Hypertension     No family history on file.  Past Surgical History:  Procedure Laterality Date   ABDOMINAL HYSTERECTOMY     KNEE ARTHROPLASTY Left 06/27/2018   PARTIAL REPLACEMENT   MEDIAL PARTIAL KNEE REPLACEMENT Left 06/27/2018   Procedure: LEFT KNEE PARTIAL REPLACEMENT;  Surgeon: Addie Cordella Hamilton, MD;  Location: [facility] OR;  Service: Orthopedics;  Laterality: Left;   WISDOM TOOTH EXTRACTION     Social History   Occupational History   Not on file  Tobacco Use   Smoking status: Never   Smokeless tobacco: Never  Vaping Use   Vaping status: Never Used  Substance and Sexual Activity   Alcohol use: No   Drug use: No   Sexual activity: Yes    Birth control/protection: Surgical

## 2023-11-24 NOTE — Addendum Note (Signed)
 Addended by: ADDIE HACKER on: 11/24/2023 07:46 AM   Modules accepted: Level of Service

## 2024-01-29 ENCOUNTER — Other Ambulatory Visit: Payer: Self-pay | Admitting: Surgical

## 2024-01-29 ENCOUNTER — Telehealth: Payer: Self-pay | Admitting: Orthopedic Surgery

## 2024-01-29 MED ORDER — TRAMADOL HCL 50 MG PO TABS: 50.0000 mg | ORAL_TABLET | Freq: Every day | ORAL | 0 refills | Status: DC | PRN

## 2024-01-29 NOTE — Telephone Encounter (Signed)
 Sent to [facility] in [place] in her chart because I'm not sure where out of town is for her

## 2024-01-29 NOTE — Telephone Encounter (Signed)
 Pt called requesting refill of Tramadol . Pt is out of town and would like it sent to [facility]. Phone number is [phone].Pt would like a callback when prescription is called in. Pt number is [phone].

## 2024-01-29 NOTE — Telephone Encounter (Signed)
 She needs [facility] sent the  [facility] at [address], [place] [address]

## 2024-01-30 ENCOUNTER — Other Ambulatory Visit: Payer: Self-pay | Admitting: Surgical

## 2024-01-30 MED ORDER — TRAMADOL HCL 50 MG PO TABS: 50.0000 mg | ORAL_TABLET | Freq: Every day | ORAL | 0 refills | Status: DC | PRN

## 2024-01-30 NOTE — Telephone Encounter (Signed)
 Sent in

## 2024-05-30 ENCOUNTER — Telehealth: Payer: Self-pay | Admitting: Orthopedic Surgery

## 2024-05-30 ENCOUNTER — Other Ambulatory Visit: Payer: Self-pay | Admitting: Orthopedic Surgery

## 2024-05-30 MED ORDER — TRAMADOL HCL 50 MG PO TABS: 50.0000 mg | ORAL_TABLET | Freq: Every day | ORAL | 0 refills | Status: AC | PRN

## 2024-05-30 NOTE — Telephone Encounter (Signed)
 Done thx

## 2024-05-30 NOTE — Telephone Encounter (Signed)
 Pt called asking for refill of tramadol . Please send to [facility] [address] [place] phone number [phone].
